# Patient Record
Sex: Female | Born: 1940
Health system: Southern US, Community
[De-identification: ages and names within clinical notes are randomized; demographics above are authoritative.]

## PROBLEM LIST (undated history)

## (undated) DIAGNOSIS — R079 Chest pain, unspecified: Secondary | ICD-10-CM

## (undated) DIAGNOSIS — E785 Hyperlipidemia, unspecified: Secondary | ICD-10-CM

## (undated) DIAGNOSIS — N390 Urinary tract infection, site not specified: Secondary | ICD-10-CM

## (undated) DIAGNOSIS — D649 Anemia, unspecified: Secondary | ICD-10-CM

## (undated) DIAGNOSIS — Z87442 Personal history of urinary calculi: Secondary | ICD-10-CM

## (undated) DIAGNOSIS — F329 Major depressive disorder, single episode, unspecified: Secondary | ICD-10-CM

## (undated) DIAGNOSIS — K635 Polyp of colon: Secondary | ICD-10-CM

## (undated) DIAGNOSIS — G629 Polyneuropathy, unspecified: Secondary | ICD-10-CM

## (undated) DIAGNOSIS — K5792 Diverticulitis of intestine, part unspecified, without perforation or abscess without bleeding: Secondary | ICD-10-CM

## (undated) DIAGNOSIS — I509 Heart failure, unspecified: Secondary | ICD-10-CM

## (undated) DIAGNOSIS — F419 Anxiety disorder, unspecified: Secondary | ICD-10-CM

## (undated) DIAGNOSIS — R05 Cough: Secondary | ICD-10-CM

## (undated) DIAGNOSIS — K631 Perforation of intestine (nontraumatic): Secondary | ICD-10-CM

## (undated) DIAGNOSIS — M069 Rheumatoid arthritis, unspecified: Secondary | ICD-10-CM

## (undated) DIAGNOSIS — G5600 Carpal tunnel syndrome, unspecified upper limb: Secondary | ICD-10-CM

## (undated) DIAGNOSIS — N189 Chronic kidney disease, unspecified: Secondary | ICD-10-CM

## (undated) DIAGNOSIS — E079 Disorder of thyroid, unspecified: Secondary | ICD-10-CM

## (undated) DIAGNOSIS — M1990 Unspecified osteoarthritis, unspecified site: Secondary | ICD-10-CM

## (undated) DIAGNOSIS — F32A Depression, unspecified: Secondary | ICD-10-CM

## (undated) DIAGNOSIS — E039 Hypothyroidism, unspecified: Secondary | ICD-10-CM

## (undated) DIAGNOSIS — R059 Cough, unspecified: Secondary | ICD-10-CM

## (undated) HISTORY — DX: Hyperlipidemia, unspecified: E78.5

## (undated) HISTORY — DX: Polyneuropathy, unspecified: G62.9

## (undated) HISTORY — PX: COLOSTOMY REVERSAL: SHX5782

## (undated) HISTORY — DX: Diverticulitis of intestine, part unspecified, without perforation or abscess without bleeding: K57.92

## (undated) HISTORY — PX: MENISCUS REPAIR: SHX5179

## (undated) HISTORY — DX: Polyp of colon: K63.5

## (undated) HISTORY — DX: Urinary tract infection, site not specified: N39.0

## (undated) HISTORY — DX: Depression, unspecified: F32.A

## (undated) HISTORY — DX: Heart failure, unspecified: I50.9

## (undated) HISTORY — DX: Anxiety disorder, unspecified: F41.9

## (undated) HISTORY — DX: Carpal tunnel syndrome, unspecified upper limb: G56.00

## (undated) HISTORY — DX: Chest pain, unspecified: R07.9

## (undated) HISTORY — PX: TONSILLECTOMY: SUR1361

## (undated) HISTORY — DX: Cough, unspecified: R05.9

## (undated) HISTORY — DX: Anemia, unspecified: D64.9

## (undated) HISTORY — PX: BREAST CYST EXCISION: SHX579

## (undated) HISTORY — PX: OTHER SURGICAL HISTORY: SHX169

## (undated) HISTORY — PX: PARATHYROIDECTOMY: SHX19

## (undated) HISTORY — DX: Major depressive disorder, single episode, unspecified: F32.9

## (undated) HISTORY — DX: Disorder of thyroid, unspecified: E07.9

## (undated) HISTORY — DX: Rheumatoid arthritis, unspecified: M06.9

## (undated) HISTORY — DX: Cough: R05

## (undated) HISTORY — PX: COLOSTOMY: SHX63

---

## 1982-11-07 HISTORY — PX: BACK SURGERY: SHX140

## 2000-11-29 ENCOUNTER — Ambulatory Visit (HOSPITAL_COMMUNITY): Admission: RE | Admit: 2000-11-29 | Discharge: 2000-11-29 | Payer: Self-pay | Admitting: Family Medicine

## 2000-11-29 ENCOUNTER — Encounter: Payer: Self-pay | Admitting: Family Medicine

## 2002-12-11 ENCOUNTER — Ambulatory Visit (HOSPITAL_COMMUNITY): Admission: RE | Admit: 2002-12-11 | Discharge: 2002-12-11 | Payer: Self-pay | Admitting: Family Medicine

## 2005-12-14 ENCOUNTER — Other Ambulatory Visit: Admission: RE | Admit: 2005-12-14 | Discharge: 2005-12-14 | Payer: Self-pay | Admitting: Family Medicine

## 2008-01-24 ENCOUNTER — Encounter: Admission: RE | Admit: 2008-01-24 | Discharge: 2008-01-24 | Payer: Self-pay | Admitting: Orthopedic Surgery

## 2008-02-01 ENCOUNTER — Encounter: Admission: RE | Admit: 2008-02-01 | Discharge: 2008-02-01 | Payer: Self-pay | Admitting: Orthopedic Surgery

## 2008-03-04 ENCOUNTER — Ambulatory Visit: Payer: Self-pay | Admitting: Internal Medicine

## 2008-03-04 DIAGNOSIS — R0602 Shortness of breath: Secondary | ICD-10-CM

## 2008-03-04 DIAGNOSIS — J984 Other disorders of lung: Secondary | ICD-10-CM | POA: Insufficient documentation

## 2008-03-04 DIAGNOSIS — R05 Cough: Secondary | ICD-10-CM

## 2008-03-04 DIAGNOSIS — I712 Thoracic aortic aneurysm, without rupture: Secondary | ICD-10-CM

## 2008-03-04 DIAGNOSIS — J309 Allergic rhinitis, unspecified: Secondary | ICD-10-CM | POA: Insufficient documentation

## 2008-03-13 ENCOUNTER — Encounter: Payer: Self-pay | Admitting: Internal Medicine

## 2008-03-16 ENCOUNTER — Inpatient Hospital Stay (HOSPITAL_COMMUNITY): Admission: EM | Admit: 2008-03-16 | Discharge: 2008-03-23 | Payer: Self-pay | Admitting: Emergency Medicine

## 2008-03-16 ENCOUNTER — Encounter (INDEPENDENT_AMBULATORY_CARE_PROVIDER_SITE_OTHER): Payer: Self-pay | Admitting: *Deleted

## 2008-05-01 ENCOUNTER — Telehealth: Payer: Self-pay | Admitting: Internal Medicine

## 2008-06-16 ENCOUNTER — Encounter: Admission: RE | Admit: 2008-06-16 | Discharge: 2008-07-07 | Payer: Self-pay | Admitting: Surgery

## 2008-07-09 ENCOUNTER — Inpatient Hospital Stay (HOSPITAL_COMMUNITY): Admission: RE | Admit: 2008-07-09 | Discharge: 2008-07-14 | Payer: Self-pay | Admitting: *Deleted

## 2008-07-09 ENCOUNTER — Encounter (INDEPENDENT_AMBULATORY_CARE_PROVIDER_SITE_OTHER): Payer: Self-pay | Admitting: *Deleted

## 2008-08-04 ENCOUNTER — Ambulatory Visit: Payer: Self-pay | Admitting: Internal Medicine

## 2008-08-05 ENCOUNTER — Telehealth: Payer: Self-pay | Admitting: Internal Medicine

## 2008-08-07 ENCOUNTER — Ambulatory Visit: Payer: Self-pay | Admitting: Surgery

## 2008-08-15 ENCOUNTER — Telehealth: Payer: Self-pay | Admitting: Internal Medicine

## 2008-10-17 ENCOUNTER — Ambulatory Visit (HOSPITAL_COMMUNITY): Admission: RE | Admit: 2008-10-17 | Discharge: 2008-10-17 | Payer: Self-pay | Admitting: Orthopaedic Surgery

## 2008-10-17 ENCOUNTER — Ambulatory Visit: Payer: Self-pay | Admitting: Vascular Surgery

## 2008-10-17 ENCOUNTER — Encounter (INDEPENDENT_AMBULATORY_CARE_PROVIDER_SITE_OTHER): Payer: Self-pay | Admitting: Orthopaedic Surgery

## 2009-06-15 ENCOUNTER — Encounter: Payer: Self-pay | Admitting: Gastroenterology

## 2009-06-26 ENCOUNTER — Encounter: Payer: Self-pay | Admitting: Gastroenterology

## 2009-07-08 ENCOUNTER — Encounter (INDEPENDENT_AMBULATORY_CARE_PROVIDER_SITE_OTHER): Payer: Self-pay | Admitting: *Deleted

## 2009-07-08 ENCOUNTER — Ambulatory Visit (HOSPITAL_COMMUNITY): Admission: RE | Admit: 2009-07-08 | Discharge: 2009-07-08 | Payer: Self-pay | Admitting: Internal Medicine

## 2009-07-30 ENCOUNTER — Ambulatory Visit: Payer: Self-pay | Admitting: Gastroenterology

## 2009-07-30 DIAGNOSIS — D509 Iron deficiency anemia, unspecified: Secondary | ICD-10-CM | POA: Insufficient documentation

## 2009-07-30 DIAGNOSIS — Z860100 Personal history of colon polyps, unspecified: Secondary | ICD-10-CM | POA: Insufficient documentation

## 2009-07-31 ENCOUNTER — Encounter: Payer: Self-pay | Admitting: Gastroenterology

## 2009-07-31 ENCOUNTER — Ambulatory Visit: Payer: Self-pay | Admitting: Gastroenterology

## 2009-07-31 ENCOUNTER — Telehealth: Payer: Self-pay | Admitting: Gastroenterology

## 2009-07-31 LAB — CONVERTED CEMR LAB
Basophils Absolute: 0 10*3/uL (ref 0.0–0.1)
Eosinophils Absolute: 0.1 10*3/uL (ref 0.0–0.7)
Folate: >20 ng/mL
Lymphocytes Relative: 19 % (ref 12.0–46.0)
MCHC: 32.1 g/dL (ref 30.0–36.0)
Monocytes Absolute: 0.4 10*3/uL (ref 0.1–1.0)
Neutrophils Relative %: 72.7 % (ref 43.0–77.0)
RDW: 18.7 % — ABNORMAL HIGH (ref 11.5–14.6)

## 2009-08-03 ENCOUNTER — Encounter: Payer: Self-pay | Admitting: Gastroenterology

## 2009-08-04 ENCOUNTER — Encounter: Payer: Self-pay | Admitting: Gastroenterology

## 2009-08-04 LAB — CONVERTED CEMR LAB
Beta Globulin: 5.2 % (ref 4.7–7.2)
IgG (Immunoglobin G), Serum: 1920 mg/dL — ABNORMAL HIGH (ref 694–1618)
IgM, Serum: 78 mg/dL (ref 60–263)
Total Protein, Serum Electrophoresis: 7.2 g/dL (ref 6.0–8.3)
Total Protein, Serum Electrophoresis: 7.1 g/dL (ref 6.0–8.3)

## 2009-08-10 ENCOUNTER — Ambulatory Visit: Payer: Self-pay | Admitting: Gastroenterology

## 2009-08-10 LAB — CONVERTED CEMR LAB
OCCULT 1: NEGATIVE
OCCULT 5: NEGATIVE

## 2009-08-12 ENCOUNTER — Encounter: Payer: Self-pay | Admitting: Gastroenterology

## 2009-08-26 ENCOUNTER — Ambulatory Visit: Payer: Self-pay | Admitting: Gastroenterology

## 2009-08-26 DIAGNOSIS — M2550 Pain in unspecified joint: Secondary | ICD-10-CM

## 2009-08-27 ENCOUNTER — Telehealth: Payer: Self-pay | Admitting: Gastroenterology

## 2009-09-03 ENCOUNTER — Encounter: Payer: Self-pay | Admitting: Gastroenterology

## 2009-09-09 ENCOUNTER — Encounter: Payer: Self-pay | Admitting: Gastroenterology

## 2009-09-21 ENCOUNTER — Encounter: Payer: Self-pay | Admitting: Gastroenterology

## 2009-10-07 ENCOUNTER — Encounter: Payer: Self-pay | Admitting: Gastroenterology

## 2009-10-12 ENCOUNTER — Encounter: Payer: Self-pay | Admitting: Gastroenterology

## 2009-11-26 ENCOUNTER — Encounter: Payer: Self-pay | Admitting: Gastroenterology

## 2009-12-28 ENCOUNTER — Encounter: Payer: Self-pay | Admitting: Gastroenterology

## 2010-01-13 ENCOUNTER — Encounter: Admission: RE | Admit: 2010-01-13 | Discharge: 2010-03-15 | Payer: Self-pay | Admitting: Internal Medicine

## 2010-01-26 ENCOUNTER — Encounter: Payer: Self-pay | Admitting: Gastroenterology

## 2010-01-27 ENCOUNTER — Encounter: Admission: RE | Admit: 2010-01-27 | Discharge: 2010-01-27 | Payer: Self-pay

## 2010-02-09 ENCOUNTER — Encounter: Payer: Self-pay | Admitting: Gastroenterology

## 2010-03-08 ENCOUNTER — Encounter: Payer: Self-pay | Admitting: Gastroenterology

## 2010-04-01 ENCOUNTER — Encounter: Payer: Self-pay | Admitting: Gastroenterology

## 2010-04-15 ENCOUNTER — Ambulatory Visit: Payer: Self-pay | Admitting: Gastroenterology

## 2010-06-15 ENCOUNTER — Encounter: Payer: Self-pay | Admitting: Gastroenterology

## 2010-08-30 ENCOUNTER — Ambulatory Visit: Payer: Self-pay | Admitting: Vascular Surgery

## 2010-09-02 ENCOUNTER — Encounter: Payer: Self-pay | Admitting: Gastroenterology

## 2010-09-07 ENCOUNTER — Ambulatory Visit: Payer: Self-pay | Admitting: Vascular Surgery

## 2010-12-02 ENCOUNTER — Encounter: Admission: RE | Admit: 2010-12-02 | Discharge: 2010-12-02 | Payer: Self-pay | Source: Home / Self Care

## 2010-12-07 NOTE — Letter (Signed)
Summary: Rheumatology/[facility]   Imported By: Lester Marcellus 03/29/2010 07:49:23  _____________________________________________________________________  External Attachment:    Type:   Image     Comment:   External Document

## 2010-12-07 NOTE — Consult Note (Signed)
Summary[facility]   Imported By: Lester Brundidge 01/08/2010 08:28:05  _____________________________________________________________________  External Attachment:    Type:   Image     Comment:   External Document

## 2010-12-07 NOTE — Consult Note (Signed)
Summary: [facility]  [facility]   Imported By: Sherian Rein 02/23/2010 10:59:30  _____________________________________________________________________  External Attachment:    Type:   Image     Comment:   External Document

## 2010-12-07 NOTE — Consult Note (Signed)
Summary: [facility]  [facility]   Imported By: Sherian Rein 09/13/2010 14:46:32  _____________________________________________________________________  External Attachment:    Type:   Image     Comment:   External Document

## 2010-12-07 NOTE — Assessment & Plan Note (Signed)
Summary: f/u constipation--ch.   History of Present Illness Visit Type: Follow-up Visit Primary GI MD: Melvia Heaps MD [facility] Primary Provider: Thayer Headings, MD  Requesting Provider: n/a Chief Complaint: chronic constipation  History of Present Illness:   Stephanie Ray has returned for follow-up of her anemia.  She has been followed at [facility] for her  rheumatologic disease causing severe joint pains.  Her main GI complaint is constipation for which she takes senna.  She goes several days without a bowel movement.   With constipation she  has mild abdominal discomfort and  worsening joint pains.  She takes ibuprofen intermittently.   GI Review of Systems    Reports abdominal pain and  bloating.     Location of  Abdominal pain: lower abdomen.    Denies acid reflux, belching, chest pain, dysphagia with liquids, dysphagia with solids, heartburn, loss of appetite, nausea, vomiting, vomiting blood, weight loss, and  weight gain.      Reports black tarry stools, constipation, hemorrhoids, and  rectal bleeding.     Denies anal fissure, change in bowel habit, diarrhea, diverticulosis, fecal incontinence, heme positive stool, irritable bowel syndrome, jaundice, light color stool, liver problems, and  rectal pain.    Current Medications (verified): 1)  Cyanocobalamin 1000 Mcg/ml Soln (Cyanocobalamin) .... Two Injections Weekly 2)  Stool Softener 100 Mg Caps (Docusate Sodium) .Marland Kitchen.. 1-2 Times Weekly 3)  Flora-Q  Caps (Probiotic Product) .... Three Tablets By Mouth Daily 4)  Vitamin D Synergy(Dosage Unknown) .... One Tablet By Mouth Two Times A Day 5)  Omega Pure 820 .... One Tablet By Mouth Two Times A Day 6)  Ibuprofen 200 Mg Tabs (Ibuprofen) .... One Tablet By [facility]  Eight Times Daily 7)  Calcium Carbonate 600 Mg Tabs (Calcium Carbonate) .... Once Daily 8)  Milk Thistle 175 Mg Caps (Milk Thistle) .... Take 1 Tablet By Mouth Once Daily 9)  Prednisone 10 Mg Tabs (Prednisone) .... Take 1 Tablet By  Mouth Once Daily 10)  Plaquenil 200 Mg Tabs (Hydroxychloroquine Sulfate) .... Take 1 Tablet Two Times A Day  Allergies (verified): 1)  ! Penicillin 2)  ! Erythromycin 3)  ! Sulfa 4)  ! Codeine 5)  ! Ketoralac 6)  ! * Aleve 7)  ! Morphine  Past History:  Past Medical History: Reviewed history from 07/30/2009 and no changes required. Allergic Rhinitis Chronic Cough since childhood Carpal Tunnel Right Wrist Anemia Anxiety Disorder Adenomatous Colon Polyps Depression Diverticulitis Hyperlipidemia ? Urinary Tract Infection  Past Surgical History: Reviewed history from 07/30/2009 and no changes required. back surgery--1984 cyst removed from breast left knee torn meniscus herniated disk colostomy-section  Family History: Reviewed history from 07/30/2009 and no changes required. cancer-mother breast hay fever - dad Family History of Colon Polyps:Brothers x 3  Family History of Heart Disease: Father  No FH of Colon Cancer:  Social History: Reviewed history from 07/30/2009 and no changes required. 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  Alcohol Use - no Illicit Drug Use - no Patient does not get regular exercise.   Vital Signs:  Patient profile:   70 year old female Height:      67 inches Weight:      152.38 pounds BMI:     23.95 Pulse rate:   76 / minute Pulse rhythm:   regular BP sitting:   106 / 68  (left arm) Cuff size:   regular  Vitals Entered By: June  McMurray CMA Duncan Dull) (April 15, 2010 8:40 AM)   Impression & Recommendations:  Problem # 1:  ANEMIA, IRON DEFICIENCY (ICD-280.9)  Her iron deficiency anemia may be related to her chronic NSAID use though stools were Hemoccult negative.  There is no evidence for malabsorption.  At this point there is nothing to suggest underlying small bowel disease associated with joint disease.  Colonoscopy in 2009 demonstrated a polyp which was  removed.  Procedure was complicated by perforation.  Additional workup including capsule endoscopy and upper endoscopy were negative for chest bleeding source.  Recommendations #1 iron supplementation MiraLax p.r.n. for constipation in lieu of senna  Problem # 2:  PAIN IN JOINT, MULTIPLE SITES (ICD-719.49) Therapy per hematology  Patient Instructions: 1)  Copy sent to : Thayer Headings, MD  2)                         Sigurd Sos, MD  [facility] 3)                         Orson Gear, MD  [facility] 4)  The medication list was reviewed and reconciled.  All changed / newly prescribed medications were explained.  A complete medication list was provided to the patient / caregiver.

## 2010-12-07 NOTE — Consult Note (Signed)
Summary: [facility]  [facility]   Imported By: Lester Leslie 11/11/2009 10:02:35  _____________________________________________________________________  External Attachment:    Type:   Image     Comment:   External Document

## 2010-12-07 NOTE — Letter (Signed)
Summary: Neurology Interval Note/[facility]  Ne[facility]rology Interval Note/[facility]   Imported By: Sherian Rein 04/21/2010 14:22:11  _____________________________________________________________________  External Attachment:    Type:   Image     Comment:   External Document

## 2010-12-07 NOTE — Letter (Signed)
Summary: [facility]umat[facility]   Imported By: Lester [place] 04/21/2010 09:01:05  _____________________________________________________________________  External Attachment:    Type:   Image     Comment:   External Document

## 2010-12-07 NOTE — Procedures (Signed)
Summary: [facility]  [facility]   Imported By: Lester [place] 07/13/2010 10:38:42  _____________________________________________________________________  External Attachment:    Type:   Image     Comment:   External Document

## 2010-12-07 NOTE — Consult Note (Signed)
Summary: [facility]  [facility]umatology Clinic Note/[facility]   Imported By: Sherian Rein 11/13/2009 13:32:36  _____________________________________________________________________  External Attachment:    Type:   Image     Comment:   External Document

## 2011-01-30 IMAGING — CT CT ABDOMEN W/ CM
4 of 5 series · 14 of 46 positions shown, 19 images · IV contrast (agent unspecified)
Comparison: 08/04/2008

 CT CHEST

07/09/2009 - DUPLICATE COPY for exam association in [facility] – No change from original report.
CLINICAL DATA: Weight loss

 CT CHEST, ABDOMEN AND PELVIS WITH CONTRAST
TECHNIQUE: Multidetector CT imaging of the chest, abdomen and
 pelvis was performed following the standard protocol during bolus
 administration of intravenous contrast.
 Contrast: 80 ml Emnipaque-6LL

[Series 2: chest/abd/pelvis · axial · 0.78mm/px · z∈[-563,-63]mm · 8 of 128 slices shown]
[im 14/128  soft-tissue]
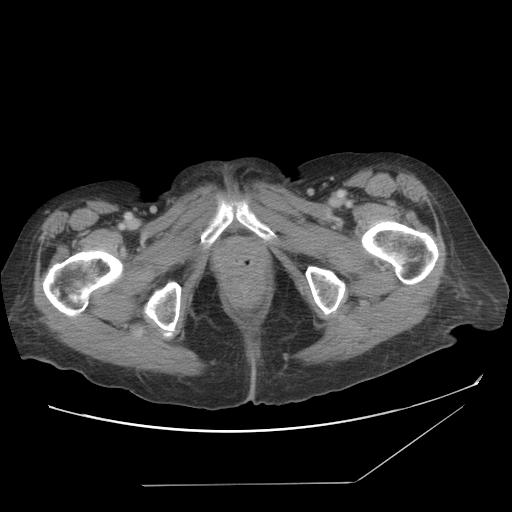
[im 27/128  soft-tissue]
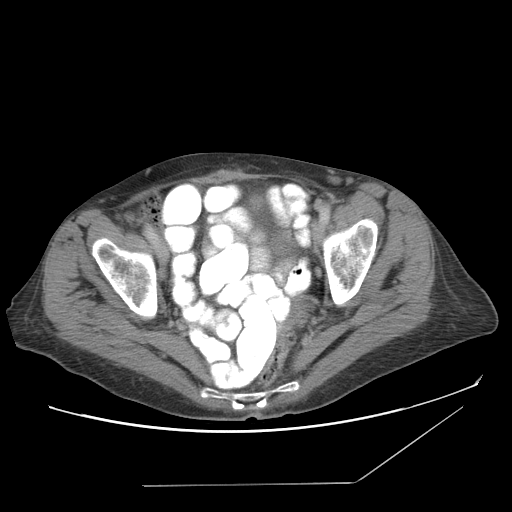
[im 41/128  soft-tissue]
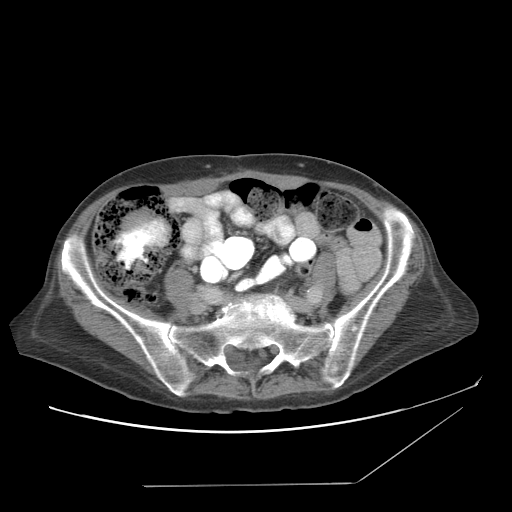
[im 54/128  soft-tissue]
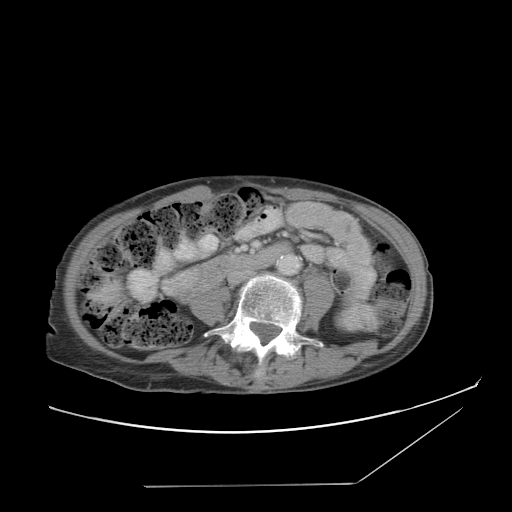
[im 74/128  soft-tissue]
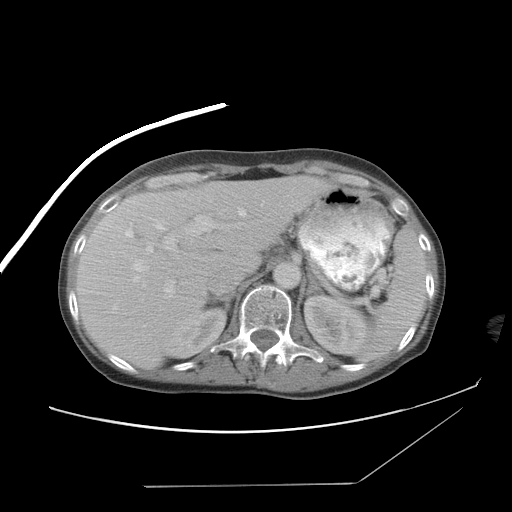
[im 87/128  soft-tissue]
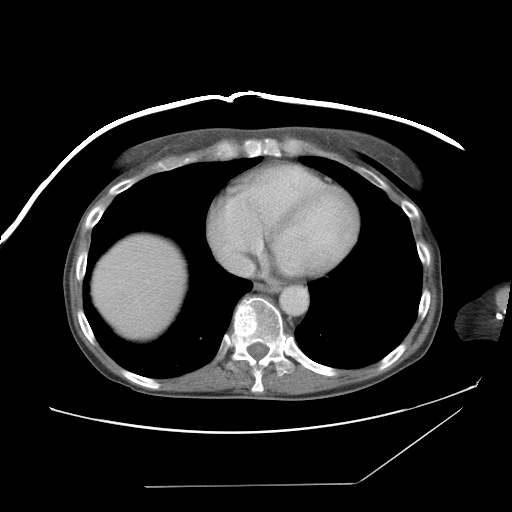
[im 101/128  soft-tissue]
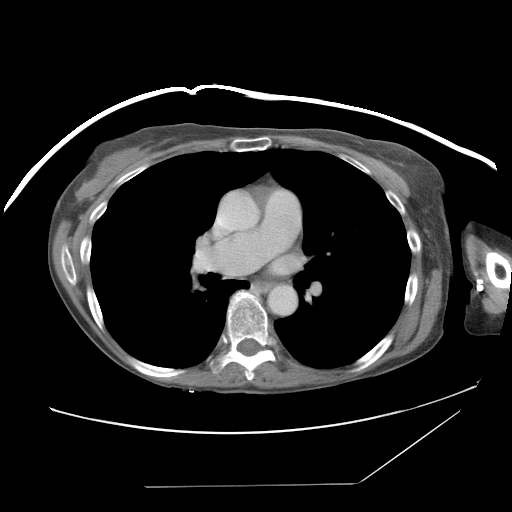
[im 114/128  soft-tissue]
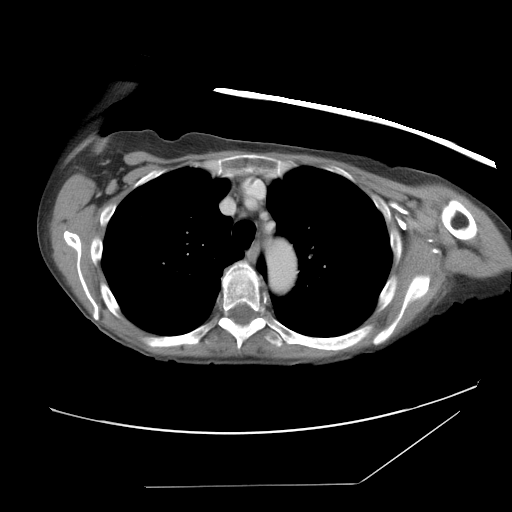

[Series 5: renal delays · axial · 0.77mm/px · z∈[-333,-283]mm · 2 of 30 slices shown, 5 images]
[im 10/30  soft-tissue]
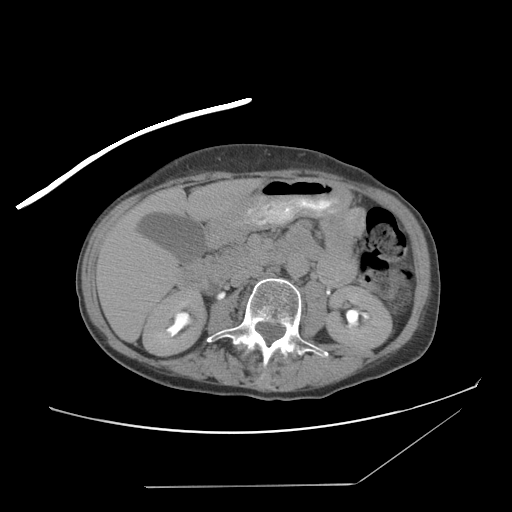
[im 10/30  lung]
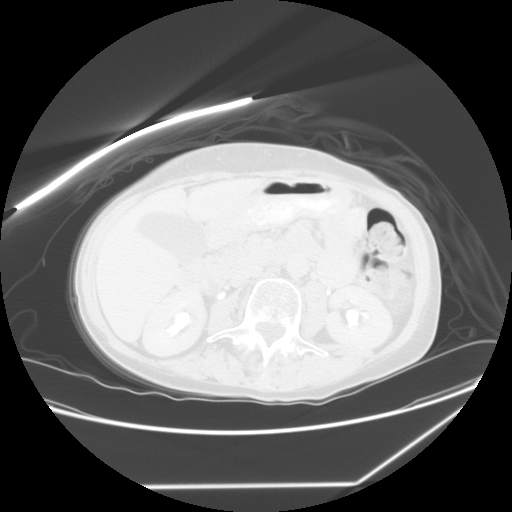
[im 10/30  bone]
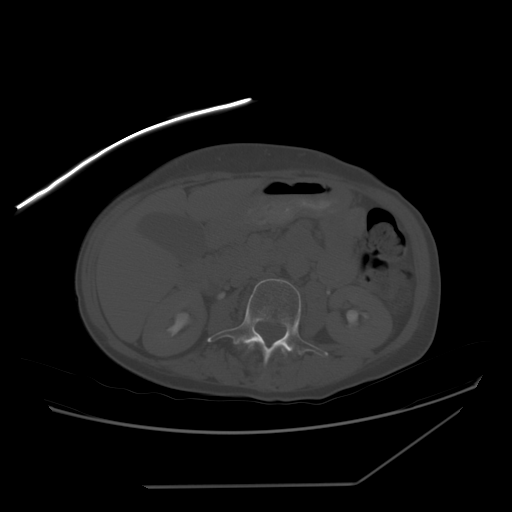
[im 20/30  soft-tissue]
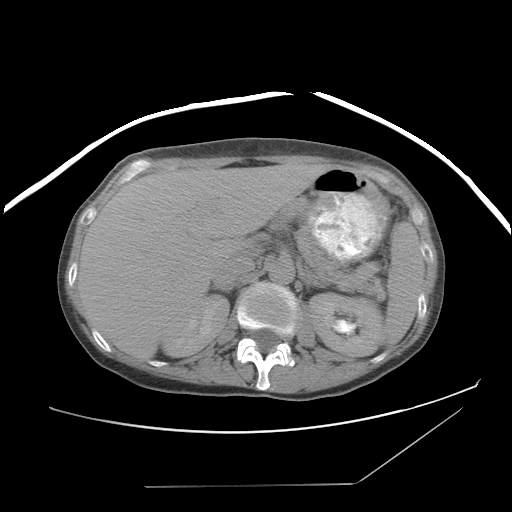
[im 20/30  lung]
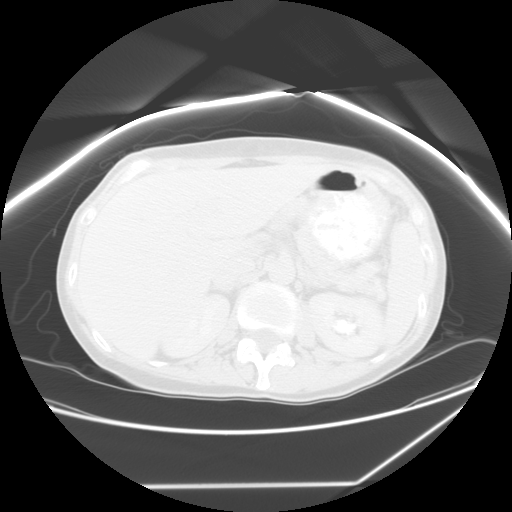

[Series 400: sag · sagittal · 0.90mm/px · 1 of 125 slices shown, 2 images]
[im 42/125  soft-tissue]
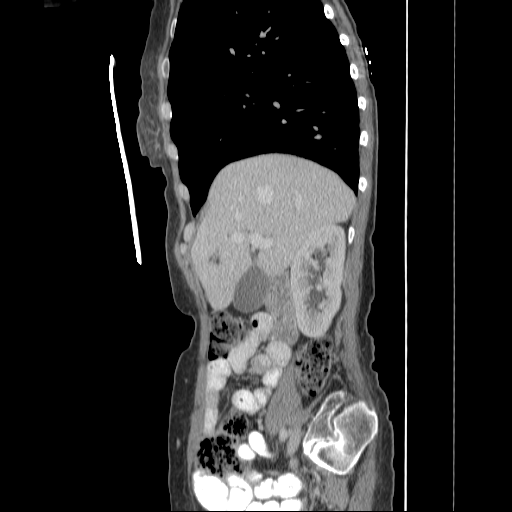
[im 42/125  bone]
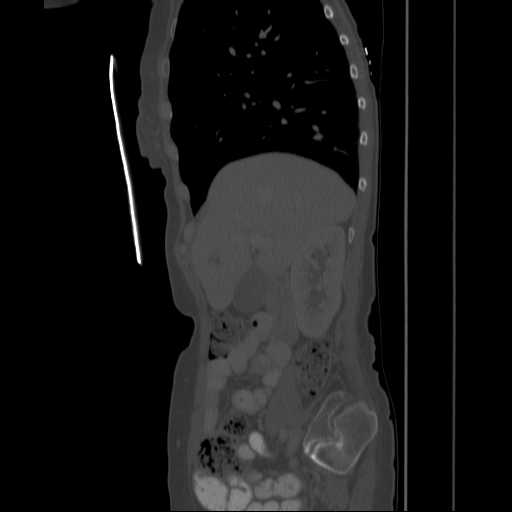

[Series 401: cor · coronal · 0.90mm/px · 3 of 89 slices shown, 4 images]
[im 30/89  soft-tissue]
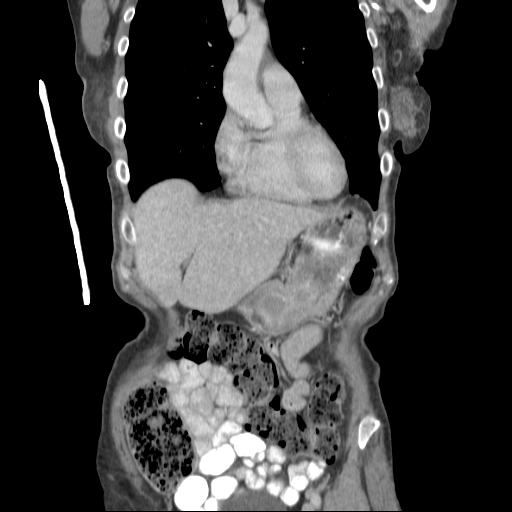
[im 40/89  soft-tissue]
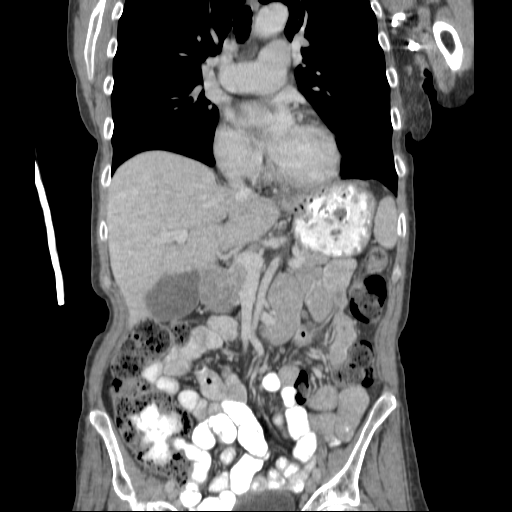
[im 40/89  bone]
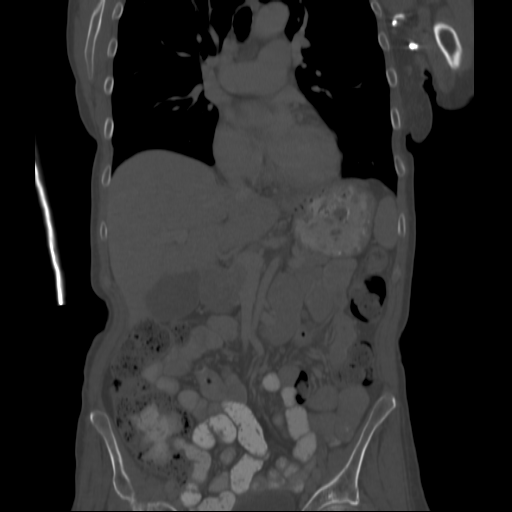
[im 49/89  soft-tissue]
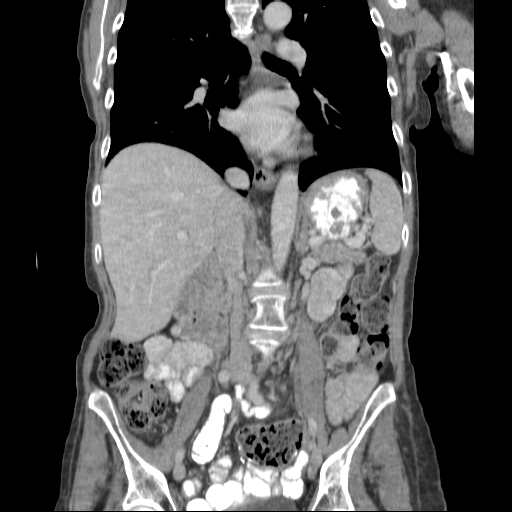

[14 of 46 positions shown; findings below may reference images not displayed]

FINDINGS: Negative abnormal mediastinal adenopathy. No
 pneumothorax or pleural effusion. Stable right lower lobe 6 mm
 nodule on image 32. Stable 4 mm nodule in the left lower lobe on
 image 31.
IMPRESSION: Stable bibasilar nodules. Stability over 2 years supports benign
 etiology.

 CT ABDOMEN
FINDINGS: Liver hypodensities at the dome are stable. Peri
 hepatic fluid has resolved. The gallbladder, spleen, adrenal
 glands, pancreas are stable. Left nephrolithiasis is suspected.
 Kidneys otherwise stable. No free fluid. Extensive stool
 throughout the colon. Small hiatal hernia is redemonstrated.
IMPRESSION: Hiatal hernia.

 Left nephrolithiasis is suspected.

 Peri hepatic fluid has resolved.

 Extensive stool.

 CT PELVIS
FINDINGS: Bladder is within normal limits. No free fluid. No
 abnormal adenopathy. Sigmoid resection has been performed with
 reanastomosis. Prominent stool throughout the rectosigmoid.
IMPRESSION: Postoperative change. No acute intrapelvic pathology.

## 2011-02-10 ENCOUNTER — Telehealth: Payer: Self-pay | Admitting: *Deleted

## 2011-02-10 NOTE — Telephone Encounter (Signed)
Summary of Call: last ct chestchest sept 2009. SHowed some nodules. She needed ct chest 18-24 months. Please call her and find out what is goiung on and pls set up CT chest Initial call taken by: Kalman Shan MD,  January 25, 2011 12:03 AM  I spoke to patient and she states she has been seeing MD at [facility] ans she thinks she had a ct chest there recently. She request to call them and find out if she did. I advised if she did have a ct chest then to have them send [place] the scan, and if not she needs a f/u here with ct chest first. Pt states she will call and let me know.

## 2011-03-09 ENCOUNTER — Telehealth: Payer: Self-pay | Admitting: Internal Medicine

## 2011-03-09 DIAGNOSIS — J984 Other disorders of lung: Secondary | ICD-10-CM

## 2011-03-09 NOTE — Telephone Encounter (Signed)
Spoke w/ pt and she wants to know if she is due for another ct scan. Pt states she had one at [facility] November 2010 and they was to fax over the report to Dr. Marchelle Gearing. Pt states she spoke w/ Victorino Dike last month and was getting this report faxed over for MR to review. Please advise Dr. Marchelle Gearing if you ever received this report and if pt needs f/u CT. Thanks  Carver Fila, CMA    *Pt aware MR is currently out of the office

## 2011-03-11 NOTE — Telephone Encounter (Signed)
Spoke w/ pt and she states she has not had a ct since 2010. Pt is aware order was sent to have ct set up and then she will see MR afterwards. Will forward to [facility] so they are aware. Please advise Thanks  Carver Fila, CMA

## 2011-03-11 NOTE — Telephone Encounter (Signed)
Scheduled ct chest at [facility] 03/15/11@2 :00pm pt transferred to schedulers to set up appt with MR

## 2011-03-11 NOTE — Telephone Encounter (Signed)
Reviewed rcords in centricity. There is an office note at [facility] rheumatologist stating that in sept 2010 she had 6mm RLL nodule. When compared to our report from Sept 2009 this si stable over 1 year. WE need to establish 2 year stability - so if she never had a scan after august 2011 she needs one (without contrast) and then see me

## 2011-03-15 ENCOUNTER — Ambulatory Visit (INDEPENDENT_AMBULATORY_CARE_PROVIDER_SITE_OTHER)
Admission: RE | Admit: 2011-03-15 | Discharge: 2011-03-15 | Disposition: A | Discharge status: Home / Self Care | Payer: Medicare Other | Source: Ambulatory Visit | Attending: Internal Medicine | Admitting: Internal Medicine

## 2011-03-15 DIAGNOSIS — J984 Other disorders of lung: Secondary | ICD-10-CM

## 2011-03-17 ENCOUNTER — Encounter: Payer: Self-pay | Admitting: Emergency Medicine

## 2011-03-18 ENCOUNTER — Encounter: Payer: Self-pay | Admitting: Internal Medicine

## 2011-03-18 ENCOUNTER — Ambulatory Visit (INDEPENDENT_AMBULATORY_CARE_PROVIDER_SITE_OTHER): Payer: Medicare Other | Admitting: Internal Medicine

## 2011-03-18 DIAGNOSIS — R059 Cough, unspecified: Secondary | ICD-10-CM

## 2011-03-18 DIAGNOSIS — R05 Cough: Secondary | ICD-10-CM

## 2011-03-18 DIAGNOSIS — J984 Other disorders of lung: Secondary | ICD-10-CM

## 2011-03-18 NOTE — Patient Instructions (Signed)
Your nodule has been stable for over 2 year No further followup needed Please return to office if you feel any respiratory difficulties Good luck

## 2011-03-18 NOTE — Assessment & Plan Note (Signed)
Stable march 2009 -> may 2012, 6mm RLL nodule No further followup

## 2011-03-18 NOTE — Progress Notes (Signed)
Subjective:    Patient ID: Stephanie Ray, female    DOB: 08-20-1941, 70 y.o.   MRN: [id number]  HPI 69 year old non-smoker with allergic cough since childhood. Originally seen in 03/04/2008 for CT scan March 2009 that showed RLL 6mm non-calcified nodule. Lost to followup after that due to PMR v RA symptoms. Now following at [facility] rheum and Dr. Becky Augusta. No longer seeing Dr. Corliss Skains. OVerall still with neuropathy, tingling, arthralgias that limit quality of life. Chronic allergic cough is very mild and she does not want any help. For nodule, she was lost to followup due to her health issues but did have a followup CT chest 07/08/2009 that showed continued stability of nodules. We called her and set up recent CT chest 5.8//2012 that showed continued stability without change (personally reviwed)   Review of Systems Constitutional:   No  weight loss, night sweats,  Fevers, chills, POSITIVE FOR FATIGUE e. HEENT:   No headaches,  Difficulty swallowing,  Tooth/dental problems,  Sore throat,                No sneezing, itching, ear ache, nasal congestion, post nasal drip,   CV:  No chest pain,  Orthopnea, PND, swelling in lower extremities, anasarca, dizziness, palpitations  GI  No heartburn, indigestion, abdominal pain, nausea, vomiting, diarrhea, change in bowel habits, loss of appetite  Resp: No shortness of breath with exertion or at rest.  No excess mucus,  No coughing up of blood.  No change in color of mucus.  No wheezing.  No chest wall deformity. MILD COUGH +  Skin: no rash or lesions.  GU: no dysuria, change in color of urine, no urgency or frequency.  No flank pain.  MS: Diffuse ARTHRALGIA +  No decreased range of motion.  No back pain.  Psych:  No change in mood or affect. No depression or anxiety.  No memory loss.     Objective:   Physical Exam  [vitalsreviewed. Constitutional: She is oriented to person, place, and time. She appears well-developed and well-nourished. No  distress.  HENT:  Head: Normocephalic and atraumatic.  Right Ear: External ear normal.  Left Ear: External ear normal.  Mouth/Throat: Oropharynx is clear and moist. No oropharyngeal exudate.  Eyes: Conjunctivae and EOM are normal. Pupils are equal, round, and reactive to light. Right eye exhibits no discharge. Left eye exhibits no discharge. No scleral icterus.  Neck: Normal range of motion. Neck supple. No JVD present. No tracheal deviation present. No thyromegaly present.  Cardiovascular: Normal rate, regular rhythm, normal heart sounds and intact distal pulses.  Exam reveals no gallop and no friction rub.   No murmur heard. Pulmonary/Chest: Effort normal and breath sounds normal. No respiratory distress. She has no wheezes. She has no rales. She exhibits no tenderness.  Abdominal: Soft. Bowel sounds are normal. She exhibits no distension and no mass. There is no tenderness. There is no rebound and no guarding.  Musculoskeletal: Normal range of motion. She exhibits no edema and no tenderness.  Lymphadenopathy:    She has no cervical adenopathy.  Neurological: She is alert and oriented to person, place, and time. She has normal reflexes. No cranial nerve deficit. She exhibits normal muscle tone. Coordination normal.  Skin: Skin is warm and dry. No rash noted. She is not diaphoretic. No erythema. No pallor.  Psychiatric: She has a normal mood and affect. Her behavior is normal. Judgment and thought content normal.  Assessment & Plan:

## 2011-03-18 NOTE — Assessment & Plan Note (Signed)
Has allergic history. Does not want active followup

## 2011-03-21 NOTE — Assessment & Plan Note (Signed)
This is very mild for her and she copes well with it. Had it all her life. She is not interested in pursing workup or mgmt for this.

## 2011-03-22 NOTE — H&P (Signed)
Stephanie Ray, Stephanie Ray               ACCOUNT NO.:  [id number]   MEDICAL RECORD NO.:  [id number]          PATIENT TYPE:  INP   LOCATION:  1225                         FACILITY:  [facility]   PHYSICIAN:  Alfonse Ras, MD   DATE OF BIRTH:  20-May-1941   DATE OF ADMISSION:  03/16/2008  DATE OF DISCHARGE:                              HISTORY & PHYSICAL   ADMISSION DIAGNOSIS:  Abdominal pain, diverticulitis.   HISTORY OF PRESENT ILLNESS:  The patient is a very pleasant 70 year old  white female who has been having some vague lower abdominal discomfort  with extension of pain to her hips and shoulders over the last 45  months.  She has been followed and treated by Dr. Corliss Skains who has  placed her on prednisone at a weaning dose but currently at 20 mg a day  and Plaquenil.  Because of her continued lower abdominal discomfort, she  was referred to Dr. Anselmo Rod.  According to the patient, she  underwent colonoscopy as an outpatient 6 days ago and had perhaps a  biopsy done, however, there is not one listed in the computer.  The  patient did fine after a colonoscopy but earlier, last evening began  having acute onset of abdominal discomfort and was brought to the  emergency room.  She underwent CAT scan which shows a small amount of  free fluid near the liver, one minimally thickened small bowel loop in  the right upper quadrant, a couple of tiny air bubbles extraluminal in  the upper abdomen, severe diverticulosis with some mild inflammation and  a small pocket of extraluminal air in the pelvis as well.  There is no  obvious abscess or minimal free fluid in the pelvis.   The patient states that when she was admitted her pain was of 10/10, but  currently is about 3/10 with a few doses of Dilaudid.  She did have  emesis x1 after CT scan and had an NG tube placed here in the ER.   REVIEW OF SYSTEMS:  Review of systems was negative for nausea or  vomiting prior to coming in, but more  significant for pain only.  Otherwise review of systems is as positive for proximal joint discomfort  including her shoulders and hips as well as some numbness in her right  hand.   PAST MEDICAL HISTORY:  None.   PAST SURGICAL HISTORY:  Significant only for colonoscopy as above.   MEDICATIONS:  1. Prednisone 20 mg once a day.  2. Plaquenil 200 mg once a day.  3. Multivitamins.   PHYSICAL EXAMINATION:  GENERAL:  On physical exam, she is an age-  appropriate white female and who is somewhat ill appearing.  VITAL SIGNS:  Her temperature is 98.9, heart rate 78, respiratory rate  is 20, and blood pressure is 138/86.  HEENT:  Exam is benign.  Normocephalic and atraumatic.  Pupils are  equal, round, reactive to light.  Oral mucosa is without lesions.  NECK:  Supple and soft without thyromegaly or cervical adenopathy.  LUNGS:  Clear to auscultation and percussion x2.  HEART:  Regular rate and rhythm without murmurs, rubs, or gallops.  ABDOMEN:  Soft, but moderately tender in the left lower quadrant.  There  is no evidence of rebound or diffuse peritonitis.  EXTREMITIES:  Show no clubbing, cyanosis, or edema.   Labs show a white blood cell count of 12.5 with 92% neutrophils.   IMPRESSION:  Probable diverticulitis with possible microperforation, but  because of the use of steroids over the last few months we will admit  her for IV antibiotics and close clinical follow-up.  Other  possibilities of course would include a primary small bowel perforation  of questionable etiology and questionable perforated ulcer, however with  the paucity of free air, I think that is unlikely.      Alfonse Ras, MD  Electronically Signed     KRE/MEDQ  D:  03/16/2008  T:  03/16/2008  Job:  [id number]   cc:   Anselmo Rod, M.D.  Fax: [phone]   Dr. Corliss Skains

## 2011-03-22 NOTE — H&P (Signed)
Stephanie Ray, Stephanie Ray               ACCOUNT NO.:  [id number]   MEDICAL RECORD NO.:  [id number]          PATIENT TYPE:  INP   LOCATION:  1522                         [facility]ACILITY[facility]   PHYSICIAN:  Alfonse Ras, MD   DATE OF BIRTH:  [birth year]   DATE OF ADMISSION:  07/09/2008  DATE OF DISCHARGE:                              HISTORY & PHYSICAL   ADMISSION DIAGNOSIS:  Status post Hartmann resection with end colostomy.   REASON FOR ADMISSION:  Takedown of colostomy.   HISTORY OF PRESENT ILLNESS:  The patient is a very pleasant 70 year old  white female who underwent a Hartmann procedure approximately 4 months  ago for perforation of sigmoid colon, status post colonoscopy.  The  patient has done well with her colostomy, has been weaned off of her  steroids, and comes in now after home bowel prep for takedown of her  colostomy.   PAST MEDICAL HISTORY:  Past medical history is significant only for  joint pain for which she is being treated by Dr. Corliss Skains.   PAST SURGICAL HISTORY:  Significant only as above.   MEDICATIONS:  Plaquenil 200 mg a day, ibuprofen p.r.n., and  multivitamins.   PHYSICAL EXAMINATION:  GENERAL:  On physical exam, she is an age-  appropriate white female in no distress.  VITAL SIGNS:  Her temperature is 98, heart rate is 76, and respiratory  rate is 20.  HEENT:  Benign.  Normocephalic and atraumatic.  Pupils are equal, round,  and reactive to light.  LUNGS:  Clear to auscultation and percussion x2.  HEART:  Regular rate and rhythm without murmurs, rubs, or gallops.  ABDOMEN: Soft, completely nontender with a nice-appearing stoma in the  left lower quadrant and a well-healed  vertical midline scar.  EXTREMITIES:  No clubbing, cyanosis, or edema.   IMPRESSION:  Status post Hartmann resection now for elective takedown of  her colostomy.  July 09, 2008      Alfonse Ras, MD  Electronically Signed     KRE/MEDQ  D:  07/09/2008  T:  07/09/2008   Job:  [id number]   cc:   Anselmo Rod, M.D.  Fax: [phone]   Dr. Corliss Skains

## 2011-03-22 NOTE — Consult Note (Signed)
NEW PATIENT CONSULTATION   Ray, Stephanie L  DOB:  10-25-1941                                       09/07/2010  CHART#:[id number]   Patient is 70 year old female referred by Dr. Rennis Chris for swelling in the  ankles.  This patient has had multiple problems over the last few years.  Has had increasing pain over both lower extremities with a diagnosis of  chronic neuropathy.  She has a rheumatologic disorder and has been  evaluated by Dr. Sigurd Sos at [facility] and is on chronic prednisone  therapy.  Over the last few months, she has noted painful swelling in  both lower extremities, particularly in the ankle and foot area but  somewhat in the calf.  She has no history of DVT, thrombophlebitis,  stasis ulcers, or varicose veins.  She has worn short-leg elastic  compression stockings at times with some benefit but is not actively  wearing them now.   CHRONIC MEDICAL PROBLEMS:  1. History of lumbar surgery.  2. History of parathyroid surgery.  3. Neuropathy.  4. Status post perforated colon with colostomy, which occurred during      a colonoscopy.  5. Negative for coronary artery disease, diabetes, hypertension, or      stroke.   SOCIAL HISTORY:  The patient is married, has 1 child.  Does not use  tobacco or alcohol.   FAMILY HISTORY:  Positive for coronary artery disease in her father.  Negative for diabetes and stroke.   REVIEW OF SYSTEMS:  Positive for lower extremity discomfort with walking  and at rest, arthritis, joint pain, muscle pain.  All other systems are  negative in review of systems.   PHYSICAL EXAMINATION:  Blood pressure is 111/78, heart rate 81,  respirations 26.  General:  She is a well-developed and well-nourished  female in no apparent distress, alert and oriented x3.  HEENT:  Normal  for age.  EOMs intact.  Lungs:  Clear to auscultation.  No rhonchi or  wheezing.  Cardiovascular:  Regular rhythm with no murmurs.  Carotid  pulses are 3+.  No  bruits audible.  Abdomen:  Soft, nontender with no  masses.  Musculoskeletal:  Free of major deformities.  Neurologic:  Normal except for decreased sensation in the feet.  Skin:  Free of  rashes.  Lower extremity exam reveals 3+ femoral and popliteal and 2+  dorsalis pedis pulses bilaterally.  Minimal edema is noted in the lower  extremities today.  She does have a few scattered spider veins in the  thigh and calf.  No varicosities are noted.  No hyperpigmentation or  ulcerations noted.   Venous duplex exam was performed in our vascular lab on 08/30/2010 which  revealed no evidence of DVT.  She does have some mild reflux in the  common femoral veins.   I do not see any severe pathology other than some mild reflux in her  common femoral veins.  The best treatment would consist of elevation of  her legs at night to be followed by elastic compression during the  daytime.  I have discussed this with her and have also stated that no  further workup is indicated for this problem at this time.  She will  return to see [place] on a p.r.n. basis.     Quita Skye Hart [place], M.D.  Electronically Signed  JDL/MEDQ  D:  09/07/2010  T:  09/08/2010  Job:  [id number]   cc:   Vania Rea. Supple, M.D.  Dr. Sigurd Sos, Rheumatology, [facility]

## 2011-03-22 NOTE — Consult Note (Signed)
NAMEGUDELIA, EUGENE               ACCOUNT NO.:  [id number]   MEDICAL RECORD NO.:  [id number]          PATIENT TYPE:  INP   LOCATION:  1225                         FACILITY:  [facility]   PHYSICIAN:  Jordan Hawks. Elnoria Howard, MD    DATE OF BIRTH:  [birth year]   DATE OF CONSULTATION:  01/15/2008  DATE OF DISCHARGE:                                 CONSULTATION   REASON FOR CONSULTATION:  Colonic perforation, abdominal pain.   HISTORY OF PRESENT ILLNESS:  This is a 70 year old female, with a past  medical history of arthritis, constipation, herniated disk, renal  stones, and UTIs, who is admitted to the hospital with complaints of  acute severe abdominal pain.  The patient underwent a recent colonoscopy  by Dr. Loreta Ave on Mar 10, 2008, for complaints of persistent bilateral lower  abdominal pain.  The patient underwent a colonoscopy for evaluation of  her bilateral abdominal pain. The procedure was performed, and she was  noted to have significant pan diverticulosis, and three polyps were  removed; two were removed with cold biopsy forceps, and a third was  removed with a hot snare.  The patient did well immediately after the  procedure.  However, on Mar 15, 2008, the patient complained of severe  abdominal pain that was acute in onset.  Because of the pain, she was  immediately directed to present to the emergency room for further  evaluation and treatment. Overnight a CT scan was performed, and she is  noted to have a small amount of free air in the sigmoid region which was  consistent with a perforation.  There was no clear evidence of  inflammation in regards to diverticulitis at that point. She was  subsequently admitted by Dr. Baruch Merl for further evaluation and  treatment, and a GI consult was requested to help with her management.   PAST MEDICAL AND SURGICAL HISTORY:  Is as stated above.   FAMILY HISTORY:  Noncontributory.   SOCIAL HISTORY:  The patient is married.  No alcohol, tobacco or  illicit  drug use.   HOME MEDICATIONS:  Prednisone, fish oil, calcium and vitamin D.   ALLERGIES:  KETOROLAC, PENICILLIN, SULFA, CODEINE, and ERYTHROMYCIN.   REVIEW OF SYSTEMS:  As stated above in History of Present Illness,  otherwise negative.   PHYSICAL EXAMINATION:  VITAL SIGNS:  Blood pressure is 110/71, heart  rate 98, respirations 16, temperature 98.4.  GENERAL:  The patient is in no acute distress, but she is uncomfortable.  HEENT:  Normocephalic, atraumatic.  Extraocular muscles intact.  NECK:  Supple.  No lymphadenopathy.  LUNGS:  Clear to auscultation bilaterally.  CARDIOVASCULAR:  Regular rate and rhythm.  ABDOMEN:  Flat, soft, tender in left lower quadrant and some tenderness  on the right side.  No rebound or rigidity.  Positive bowel sounds.  EXTREMITIES:  No clubbing, cyanosis or edema.   LABORATORY VALUES:  White blood cell count is 11.6, hemoglobin 11.6,  platelets at 228.  Sodium 141, potassium 4.0, chloride 106, CO2 27,  glucose 107, BUN 14, creatinine 0.9, total bilirubin 1.0, alkaline  phosphatase 63, AST  17, ALT 16.  Urinalysis is negative.   IMPRESSION:  1. Colonic perforation.  2. Sigmoid diverticulosis.   After evaluation of the patient, it appears that there is a perforation;  however, the exact nature of the perforation is uncertain at this time  which may be secondary to diverticulitis with resultant perforation or a  post polypectomy perforation. Upon review of Dr. Kenna Gilbert prior  colonoscopy report, there were no complications with removal of the  polyp with the hot snare.  The patient is currently on prednisone, and  this can result in a weakening of her colonic tissue after a hot snare.  I discussed the case with Dr. Colin Benton, and the plan is to take the patient  to the operating room in order to identify the cause of the perforation.   PLAN:  1. Await surgical evaluation.  2. Continue with supportive care.      Jordan Hawks Elnoria Howard, MD   Electronically Signed     PDH/MEDQ  D:  03/16/2008  T:  03/16/2008  Job:  [id number]   cc:   Pollyann Savoy, M.D.  Fax: [phone]   Deatra James, M.D.  Fax: [phone]   Alfonse Ras, MD  [address]  [place]  [place] [address]

## 2011-03-22 NOTE — Procedures (Signed)
DUPLEX DEEP VENOUS EXAM - LOWER EXTREMITY   INDICATION:  Edema/pain per standing order.   HISTORY:  Edema:  Complaint of left ankle swelling for 1 month.  Trauma/Surgery:  History of left Baker's cyst removal per the patient.  Pain:  Bilateral lower leg/deep pain when standing or with pressure.  PE:  No.  Previous DVT:  No.  Anticoagulants:  Other:   DUPLEX EXAM:                CFV   SFV   PopV  PTV    GSV                R  L  R  L  R  L  R   L  R  L  Thrombosis    o  o  o  o  o  o  o   o  o  o  Spontaneous   +  +  +  +  +  +  +   +  +  +  Phasic        +  +  +  +  +  +  +   +  +  +  Augmentation  +  +  +  +  +  +  +   +  +  +  Compressible  +  +  +  +  +  +  +   +  +  +  Competent     o  o  +  +  +  +  +   +  +  +   Legend:  + - yes  o - no  p - partial  D - decreased   IMPRESSION:  1. No evidence of deep vein thrombosis noted in the bilateral lower      extremities.  2. Clinically significant reflux noted focally in the bilateral common      femoral veins.  3. There is a cystic structure measuring 3.9 x 1.3 x 1.4 cm noted in      the left medial popliteal fossa which is consistent with a Baker's      cyst.      _____________________________  Quita Skye. Hart [place], M.D.   CH/MEDQ  D:  08/30/2010  T:  08/30/2010  Job:  [id number]

## 2011-03-22 NOTE — Procedures (Signed)
DUPLEX DEEP VENOUS EXAM - LOWER EXTREMITY   INDICATION:  Three days of left leg pain and edema.   HISTORY:  Edema:  Three days, left leg.  Trauma/Surgery:  Recent reverse colostomy.  Pain:  Left leg.  PE:  No.  Previous DVT:  No.  Anticoagulants:  No.  Other:   DUPLEX EXAM:                CFV   SFV   PopV  PTV    GSV                R  L  R  L  R  L  R   L  R  L  Thrombosis    o  o     o     o      o     o  Spontaneous   +  +     +     +      +     +  Phasic        +  +     +     +      +     +  Augmentation  +  +     +     +      +     +  Compressible  +  +     +     +      +     +  Competent     P  P     +     +      +     +   Legend:  + - yes  o - no  p - partial  D - decreased   IMPRESSION:  1. No evidence of left leg deep or superficial vein thrombus.  2. Mild reflux is seen in the common femoral veins bilaterally.  3. Large irregular hyperechoic structure seen at the left popliteal      fossa, consistent with ruptured baker's cyst.   _____________________________  V. Charlena Cross, MD   MC/MEDQ  D:  08/07/2008  T:  08/07/2008  Job:  [id number]

## 2011-03-22 NOTE — Op Note (Signed)
Stephanie Ray, Stephanie Ray               ACCOUNT NO.:  [id number]   MEDICAL RECORD NO.:  [id number]          PATIENT TYPE:  INP   LOCATION:  1225                         FACILITY:  [facility]   PHYSICIAN:  Alfonse Ras, MD   DATE OF BIRTH:  05-01-1941   DATE OF PROCEDURE:  03/16/2008  DATE OF DISCHARGE:                               OPERATIVE REPORT   PREOPERATIVE DIAGNOSIS:  Probable perforated viscus.   POSTOPERATIVE DIAGNOSIS:  Perforated sigmoid colon.   PROCEDURE:  Laparoscopy, exploratory laparotomy, and Hartmann resection  with end colostomy.   SURGEON:  Alfonse Ras, M.D.   ASSISTANT:  Clovis Pu. Cornett, M.D.   DESCRIPTION:  The patient was taken to the operating room, placed in  supine position.  After adequate general anesthesia was induced using  endotracheal tube the abdomen was prepped and draped in normal sterile  fashion using a transverse infraumbilical incision, I dissected down to  the fascia.  This was opened vertically.  Peritoneum was entered and 0  Vicryl pursestring suture was placed around the fascial defect.  Hassan  trocar was placed in the abdomen and pneumoperitoneum was obtained.  Laparoscopy revealed a fair amount of purulent material near the liver  in the left pericolic gutter and the right pericolic gutter.  At this  point knowing the patient's history of being on steroids and probable  some perforation status post colonoscopy, I opted to open the patient.   A vertical midline incision was made and fascia was opened and  peritoneum was again entered.  All purulent material was aspirated, on  mobilizing the sigmoid colon about a 2 cm antimesenteric perforation was  identified with fecal contamination surrounding it.  This was mobilized  along the line of Toldt.  The colon was transected using a GIA stapling  device distal to the perforation.  About 15 cm proximal to the  perforation the colon was also transected using a 75 mm GIA stapling  device.   The mesentery was taken down using the LigaSure, left ureter  was identified and preserved.  The abdomen was copiously irrigated with  about 12 liters of irrigation.  The circular incision was made in the  left abdomen and a cruciate incision was made in the anterior rectus  fascia.  The descending colon was brought through this for colostomy.  The fascia was closed with running #1 Novofil and #5 Ethibond retention  sutures.  Skin was closed very loosely with staples and packed,  colostomy appliance was applied.  The patient tolerated the procedure  well and went to the recovery room in good condition.      Alfonse Ras, MD  Electronically Signed    KRE/MEDQ  D:  03/16/2008  T:  03/16/2008  Job:  [id number]   cc:   Anselmo Rod, M.D.  Fax: [phone]

## 2011-03-22 NOTE — Discharge Summary (Signed)
Stephanie Ray, Stephanie Ray               ACCOUNT NO.:  [id number]   MEDICAL RECORD NO.:  [id number]          PATIENT TYPE:  INP   LOCATION:  1522                         FACILITY:  [facility]   PHYSICIAN:  Alfonse Ras, MD   DATE OF BIRTH:  October 17, 1941   DATE OF ADMISSION:  07/09/2008  DATE OF DISCHARGE:  07/14/2008                               DISCHARGE SUMMARY   ADMISSION DIAGNOSIS:  History of perforated colon and colostomy.   DISCHARGE DIAGNOSIS:  History of perforated colon and colostomy.   CONDITION ON DISCHARGE:  Good and improved.   DISPOSITION:  Discharged to home.   MEDICATIONS:  Vicodin for pain.   FOLLOW-UP:  With me 1 week after discharge.   BRIEF HISTORY OF PRESENT ILLNESS:  The patient is a 70 year old white  female who had a Hartmann resection with end colostomy in May of this  year and now presents for takedown of the colostomy and for bowel prep,  she is admitted.   HOSPITAL COURSE:  The patient was admitted.  Taken to the operating room  underwent takedown of her colostomy, which was uneventful.  She did well  postoperatively.  She remained on a PCA with Foley in place until  postoperative day #2, at which time the Foley was removed and she was  started on clear liquids.  Her diet was slowly advanced over the next 3  days and by postoperative day #5, she was ready to be discharged home.      Alfonse Ras, MD  Electronically Signed     KRE/MEDQ  D:  07/31/2008  T:  07/31/2008  Job:  [id number]

## 2011-03-22 NOTE — Procedures (Signed)
DUPLEX DEEP VENOUS EXAM - LOWER EXTREMITY   INDICATION:  Edema/pain per standing order.   HISTORY:  Edema:  Complaint of left ankle swelling for 1 month.  Trauma/Surgery:  History of left Baker's cyst removal per the patient.  Pain:  Bilateral lower leg/deep pain when standing or with pressure.  PE:  No.  Previous DVT:  No.  Anticoagulants:  Other:   DUPLEX EXAM:                CFV   SFV   PopV  PTV    GSV                R  L  R  L  R  L  R   L  R  L  Thrombosis    o  o  o  o  o  o  o   o  o  o  Spontaneous   +  +  +  +  +  +  +   +  +  +  Phasic        +  +  +  +  +  +  +   +  +  +  Augmentation  +  +  +  +  +  +  +   +  +  +  Compressible  +  +  +  +  +  +  +   +  +  +  Competent     o  o  +  +  +  +  +   +  +  +   Legend:  + - yes  o - no  p - partial  D - decreased   IMPRESSION:  1. No evidence of deep vein thrombosis noted in the bilateral lower      extremities.  2. Clinically significant reflux noted focally in the bilateral common      femoral veins.  3. There is a cystic structure measuring 3.9 x 1.3 x 1.4 cm noted in      the left medial popliteal fossa which is consistent with a Baker's      cyst.      _____________________________  James D. Lawson, M.D.   CH/MEDQ  D:  08/30/2010  T:  08/30/2010  Job:  [id number] 

## 2011-03-22 NOTE — Op Note (Signed)
Stephanie Ray, Stephanie Ray               ACCOUNT NO.:  [id number]   MEDICAL RECORD NO.:  [id number]          PATIENT TYPE:  INP   LOCATION:  1522                         FACILITY:  [facility]   PHYSICIAN:  Alfonse Ras, MD   DATE OF BIRTH:  1941/08/21   DATE OF PROCEDURE:  DATE OF DISCHARGE:                               OPERATIVE REPORT   PREOPERATIVE DIAGNOSIS:  Status post Hartmann resection with end  ileostomy.   POSTOPERATIVE DIAGNOSIS:  Status post Hartmann resection with end  ileostomy.   PROCEDURES:  Takedown of colostomy.   SURGEON:  Alfonse Ras, M.D.   ASSISTANT:  Leonie Man, M.D.   ANESTHESIA:  General.   DESCRIPTION OF PROCEDURE:  The patient was taken to the operating room  and placed in supine position.  After adequate general anesthesia was  induced, the patient was placed in lithotomy position.  Colostomy was  closed with interrupted 2-0 silk sutures.  The abdomen was prepped and  draped in normal sterile fashion.  Using the previous vertical midline  incision, I dissected down to the fascia which was opened vertically.  Previously, we placed Novafil and sutures were removed.  Peritoneum was  entered and a number of filmy adhesions were taken down.  The colostomy  was surrounded easily.  Elliptical incision was made around the  colostomy at the skin and the colostomy was reduced into the abdominal  cavity.  It was clamped.  The rectal stump was identified and mobilized  along the peritoneal edge.  This provided significant length, both on  the proximal end and on the distal end and, therefore, a side-to-side  colocolostomy was performed.  The end of the colostomy was transected  using a GIA stapling device and then laying the descending colon next to  the rectum without any tension, a side-to-side colocolostomy was  performed with a 75-mm GIA stapling device.  The defect was closed with  a 55-mm TA device.  A stitch was placed at the crotch of the  anastomosis  and had a very widely patent anastomosis.  The anastomosis itself was  covered with Tisseel.  The abdomen was copiously irrigated.  Left and  right ureters had been identified.  The colostomy fascial defect was  closed with interrupted #1 Novafil.  The midline incision was closed  with running #1 Novafil.  The skin was closed with staples.  The patient  tolerated the procedure well and went to PACU in good condition.      Alfonse Ras, MD  Electronically Signed    KRE/MEDQ  D:  07/09/2008  T:  07/09/2008  Job:  [id number]   cc:   Anselmo Rod, M.D.  Fax: [phone]

## 2011-03-25 NOTE — Discharge Summary (Signed)
Stephanie Ray, RAIDER               ACCOUNT NO.:  [id number]   MEDICAL RECORD NO.:  [id number]          PATIENT TYPE:  INP   LOCATION:  1521                         FACILITY:  [facility]   PHYSICIAN:  Alfonse Ras, MD   DATE OF BIRTH:  11-11-1940   DATE OF ADMISSION:  03/16/2008  DATE OF DISCHARGE:  03/23/2008                               DISCHARGE SUMMARY   ADMISSION DIAGNOSIS:  Perforated viscus.   DISCHARGE DIAGNOSIS:  Perforated viscus.   CONDITION ON DISCHARGE:  Good and improved.   DISPOSITION:  Discharged to home.  Follow-up is with me 1 week after  discharge.   HISTORY OF PRESENT ILLNESS:  The patient is a very pleasant 70 year old  white female who had presented to the emergency room with abdominal pain  after undergoing colonoscopy 4 days prior.  The patient did have  polypectomy at that time.  On CT scan, the patient was noted to have a  small amount of free air in the sigmoid region consistent with  perforation although the patient's exam showed minimal tenderness  because she was on steroids and was leukopenic.  It was opted to admit  her to the hospital on IV antibiotics and take her to the operating room  for exploratory laparotomy.   HOSPITAL COURSE:  She was taken to the operating room where she was  found to have a perforated sigmoid colon, and Hartmann procedure was  performed.  Postoperatively, her ileus took about 5 days to resolve.  She was maintained on IV Cipro and Flagyl and a IV steroid taper.  She  had colostomy teaching, and she was tolerating that well and on  postoperative day 6, she was ready for discharge home.   MEDICATIONS:  Medications at discharge include Vicodin.  Follow-up is  with me in 10 days after discharge.      Alfonse Ras, MD  Electronically Signed     KRE/MEDQ  D:  05/20/2008  T:  05/20/2008  Job:  [id number]   cc:   Jordan Hawks. Elnoria Howard, MD  Fax: [phone]

## 2011-08-03 LAB — BASIC METABOLIC PANEL
BUN: 5 — ABNORMAL LOW
Calcium: 9.3
Chloride: 104
GFR calc Af Amer: >60
GFR calc Af Amer: >60
GFR calc non Af Amer: >60
GFR calc non Af Amer: >60
Glucose, Bld: 136 — ABNORMAL HIGH
Potassium: 3 — ABNORMAL LOW
Potassium: 3.2 — ABNORMAL LOW
Sodium: 135
Sodium: 138

## 2011-08-10 LAB — BASIC METABOLIC PANEL
BUN: 0 — ABNORMAL LOW
CO2: 27
CO2: 27
Calcium: 10.1
Chloride: 105
Chloride: 108
Creatinine, Ser: 0.53
GFR calc Af Amer: >60
GFR calc Af Amer: >60
Potassium: 3.9
Sodium: 135

## 2011-08-10 LAB — CBC
HCT: 27.9 — ABNORMAL LOW
HCT: 28.6 — ABNORMAL LOW
Hemoglobin: 9.3 — ABNORMAL LOW
MCHC: 32.6
MCHC: 32.7
MCHC: 33.1
MCV: 82.2
MCV: 83.1
MCV: 83.8
Platelets: 365
Platelets: 409 — ABNORMAL HIGH
RBC: 3.39 — ABNORMAL LOW
RBC: 3.41 — ABNORMAL LOW
WBC: 4
WBC: 5.8
WBC: 6.2

## 2011-11-11 DIAGNOSIS — H43819 Vitreous degeneration, unspecified eye: Secondary | ICD-10-CM | POA: Diagnosis not present

## 2011-11-14 DIAGNOSIS — M2550 Pain in unspecified joint: Secondary | ICD-10-CM | POA: Diagnosis not present

## 2011-11-18 DIAGNOSIS — M2010 Hallux valgus (acquired), unspecified foot: Secondary | ICD-10-CM | POA: Diagnosis not present

## 2011-11-18 DIAGNOSIS — M79609 Pain in unspecified limb: Secondary | ICD-10-CM | POA: Diagnosis not present

## 2011-11-18 DIAGNOSIS — M899 Disorder of bone, unspecified: Secondary | ICD-10-CM | POA: Diagnosis not present

## 2011-11-18 DIAGNOSIS — M949 Disorder of cartilage, unspecified: Secondary | ICD-10-CM | POA: Diagnosis not present

## 2011-11-18 DIAGNOSIS — R700 Elevated erythrocyte sedimentation rate: Secondary | ICD-10-CM | POA: Diagnosis not present

## 2011-11-18 DIAGNOSIS — M2550 Pain in unspecified joint: Secondary | ICD-10-CM | POA: Diagnosis not present

## 2011-11-18 DIAGNOSIS — M25579 Pain in unspecified ankle and joints of unspecified foot: Secondary | ICD-10-CM | POA: Diagnosis not present

## 2011-11-18 DIAGNOSIS — M259 Joint disorder, unspecified: Secondary | ICD-10-CM | POA: Diagnosis not present

## 2011-11-25 DIAGNOSIS — J329 Chronic sinusitis, unspecified: Secondary | ICD-10-CM | POA: Diagnosis not present

## 2011-12-13 DIAGNOSIS — J329 Chronic sinusitis, unspecified: Secondary | ICD-10-CM | POA: Diagnosis not present

## 2011-12-19 DIAGNOSIS — H10019 Acute follicular conjunctivitis, unspecified eye: Secondary | ICD-10-CM | POA: Diagnosis not present

## 2011-12-20 DIAGNOSIS — M25579 Pain in unspecified ankle and joints of unspecified foot: Secondary | ICD-10-CM | POA: Diagnosis not present

## 2012-01-09 DIAGNOSIS — M2550 Pain in unspecified joint: Secondary | ICD-10-CM | POA: Diagnosis not present

## 2012-01-09 DIAGNOSIS — E559 Vitamin D deficiency, unspecified: Secondary | ICD-10-CM | POA: Diagnosis not present

## 2012-01-09 DIAGNOSIS — R7989 Other specified abnormal findings of blood chemistry: Secondary | ICD-10-CM | POA: Diagnosis not present

## 2012-01-09 DIAGNOSIS — E039 Hypothyroidism, unspecified: Secondary | ICD-10-CM | POA: Diagnosis not present

## 2012-01-16 DIAGNOSIS — M2550 Pain in unspecified joint: Secondary | ICD-10-CM | POA: Diagnosis not present

## 2012-02-06 DIAGNOSIS — M2550 Pain in unspecified joint: Secondary | ICD-10-CM | POA: Diagnosis not present

## 2012-03-13 DIAGNOSIS — Z1231 Encounter for screening mammogram for malignant neoplasm of breast: Secondary | ICD-10-CM | POA: Diagnosis not present

## 2012-03-21 DIAGNOSIS — N951 Menopausal and female climacteric states: Secondary | ICD-10-CM | POA: Diagnosis not present

## 2012-03-27 DIAGNOSIS — R7989 Other specified abnormal findings of blood chemistry: Secondary | ICD-10-CM | POA: Diagnosis not present

## 2012-03-27 DIAGNOSIS — M2550 Pain in unspecified joint: Secondary | ICD-10-CM | POA: Diagnosis not present

## 2012-03-27 DIAGNOSIS — E559 Vitamin D deficiency, unspecified: Secondary | ICD-10-CM | POA: Diagnosis not present

## 2012-03-27 DIAGNOSIS — E039 Hypothyroidism, unspecified: Secondary | ICD-10-CM | POA: Diagnosis not present

## 2012-04-11 DIAGNOSIS — M2550 Pain in unspecified joint: Secondary | ICD-10-CM | POA: Diagnosis not present

## 2012-04-23 DIAGNOSIS — M2550 Pain in unspecified joint: Secondary | ICD-10-CM | POA: Diagnosis not present

## 2012-04-24 DIAGNOSIS — M2550 Pain in unspecified joint: Secondary | ICD-10-CM | POA: Diagnosis not present

## 2012-04-30 DIAGNOSIS — M2550 Pain in unspecified joint: Secondary | ICD-10-CM | POA: Diagnosis not present

## 2012-06-05 DIAGNOSIS — M2550 Pain in unspecified joint: Secondary | ICD-10-CM | POA: Diagnosis not present

## 2012-06-26 DIAGNOSIS — E559 Vitamin D deficiency, unspecified: Secondary | ICD-10-CM | POA: Diagnosis not present

## 2012-06-26 DIAGNOSIS — R7989 Other specified abnormal findings of blood chemistry: Secondary | ICD-10-CM | POA: Diagnosis not present

## 2012-06-26 DIAGNOSIS — E039 Hypothyroidism, unspecified: Secondary | ICD-10-CM | POA: Diagnosis not present

## 2012-06-26 DIAGNOSIS — R5381 Other malaise: Secondary | ICD-10-CM | POA: Diagnosis not present

## 2012-06-26 DIAGNOSIS — M2550 Pain in unspecified joint: Secondary | ICD-10-CM | POA: Diagnosis not present

## 2012-06-26 DIAGNOSIS — R799 Abnormal finding of blood chemistry, unspecified: Secondary | ICD-10-CM | POA: Diagnosis not present

## 2012-07-02 DIAGNOSIS — Z01419 Encounter for gynecological examination (general) (routine) without abnormal findings: Secondary | ICD-10-CM | POA: Diagnosis not present

## 2012-07-05 DIAGNOSIS — M2550 Pain in unspecified joint: Secondary | ICD-10-CM | POA: Diagnosis not present

## 2012-07-19 DIAGNOSIS — R05 Cough: Secondary | ICD-10-CM | POA: Diagnosis not present

## 2012-07-26 DIAGNOSIS — R05 Cough: Secondary | ICD-10-CM | POA: Diagnosis not present

## 2012-08-16 DIAGNOSIS — M2550 Pain in unspecified joint: Secondary | ICD-10-CM | POA: Diagnosis not present

## 2012-09-03 DIAGNOSIS — M2550 Pain in unspecified joint: Secondary | ICD-10-CM | POA: Diagnosis not present

## 2012-09-17 DIAGNOSIS — H2510 Age-related nuclear cataract, unspecified eye: Secondary | ICD-10-CM | POA: Diagnosis not present

## 2012-09-28 DIAGNOSIS — M2550 Pain in unspecified joint: Secondary | ICD-10-CM | POA: Diagnosis not present

## 2012-10-10 DIAGNOSIS — R5381 Other malaise: Secondary | ICD-10-CM | POA: Diagnosis not present

## 2012-10-10 DIAGNOSIS — M2550 Pain in unspecified joint: Secondary | ICD-10-CM | POA: Diagnosis not present

## 2012-10-10 DIAGNOSIS — E559 Vitamin D deficiency, unspecified: Secondary | ICD-10-CM | POA: Diagnosis not present

## 2012-10-10 DIAGNOSIS — L272 Dermatitis due to ingested food: Secondary | ICD-10-CM | POA: Diagnosis not present

## 2012-10-10 DIAGNOSIS — R5383 Other fatigue: Secondary | ICD-10-CM | POA: Diagnosis not present

## 2012-10-10 DIAGNOSIS — R7989 Other specified abnormal findings of blood chemistry: Secondary | ICD-10-CM | POA: Diagnosis not present

## 2012-10-18 DIAGNOSIS — M2550 Pain in unspecified joint: Secondary | ICD-10-CM | POA: Diagnosis not present

## 2012-10-29 DIAGNOSIS — M2550 Pain in unspecified joint: Secondary | ICD-10-CM | POA: Diagnosis not present

## 2012-11-21 DIAGNOSIS — M1289 Other specific arthropathies, not elsewhere classified, multiple sites: Secondary | ICD-10-CM | POA: Diagnosis not present

## 2012-11-27 DIAGNOSIS — M069 Rheumatoid arthritis, unspecified: Secondary | ICD-10-CM | POA: Diagnosis not present

## 2012-11-27 DIAGNOSIS — M2550 Pain in unspecified joint: Secondary | ICD-10-CM | POA: Diagnosis not present

## 2012-12-07 DIAGNOSIS — M2550 Pain in unspecified joint: Secondary | ICD-10-CM | POA: Diagnosis not present

## 2013-01-28 DIAGNOSIS — M2550 Pain in unspecified joint: Secondary | ICD-10-CM | POA: Diagnosis not present

## 2013-01-31 ENCOUNTER — Encounter (HOSPITAL_COMMUNITY): Payer: Self-pay | Admitting: *Deleted

## 2013-01-31 ENCOUNTER — Emergency Department (HOSPITAL_COMMUNITY)
Admission: EM | Admit: 2013-01-31 | Discharge: 2013-01-31 | Disposition: A | Discharge status: Home / Self Care | Payer: Medicare Other | Attending: Emergency Medicine | Admitting: Emergency Medicine

## 2013-01-31 DIAGNOSIS — G5600 Carpal tunnel syndrome, unspecified upper limb: Secondary | ICD-10-CM | POA: Insufficient documentation

## 2013-01-31 DIAGNOSIS — F411 Generalized anxiety disorder: Secondary | ICD-10-CM | POA: Diagnosis not present

## 2013-01-31 DIAGNOSIS — Z862 Personal history of diseases of the blood and blood-forming organs and certain disorders involving the immune mechanism: Secondary | ICD-10-CM | POA: Insufficient documentation

## 2013-01-31 DIAGNOSIS — E785 Hyperlipidemia, unspecified: Secondary | ICD-10-CM | POA: Diagnosis not present

## 2013-01-31 DIAGNOSIS — Z860100 Personal history of colon polyps, unspecified: Secondary | ICD-10-CM | POA: Insufficient documentation

## 2013-01-31 DIAGNOSIS — J309 Allergic rhinitis, unspecified: Secondary | ICD-10-CM | POA: Diagnosis not present

## 2013-01-31 DIAGNOSIS — M316 Other giant cell arteritis: Secondary | ICD-10-CM | POA: Insufficient documentation

## 2013-01-31 DIAGNOSIS — F329 Major depressive disorder, single episode, unspecified: Secondary | ICD-10-CM | POA: Insufficient documentation

## 2013-01-31 DIAGNOSIS — R5381 Other malaise: Secondary | ICD-10-CM | POA: Diagnosis not present

## 2013-01-31 DIAGNOSIS — IMO0001 Reserved for inherently not codable concepts without codable children: Secondary | ICD-10-CM | POA: Diagnosis not present

## 2013-01-31 DIAGNOSIS — F3289 Other specified depressive episodes: Secondary | ICD-10-CM | POA: Insufficient documentation

## 2013-01-31 DIAGNOSIS — R21 Rash and other nonspecific skin eruption: Secondary | ICD-10-CM | POA: Insufficient documentation

## 2013-01-31 DIAGNOSIS — Z87440 Personal history of urinary (tract) infections: Secondary | ICD-10-CM | POA: Diagnosis not present

## 2013-01-31 DIAGNOSIS — G7089 Other specified myoneural disorders: Secondary | ICD-10-CM | POA: Diagnosis not present

## 2013-01-31 DIAGNOSIS — Z8719 Personal history of other diseases of the digestive system: Secondary | ICD-10-CM | POA: Insufficient documentation

## 2013-01-31 DIAGNOSIS — R05 Cough: Secondary | ICD-10-CM | POA: Insufficient documentation

## 2013-01-31 DIAGNOSIS — Z79899 Other long term (current) drug therapy: Secondary | ICD-10-CM | POA: Insufficient documentation

## 2013-01-31 DIAGNOSIS — R059 Cough, unspecified: Secondary | ICD-10-CM | POA: Diagnosis not present

## 2013-01-31 DIAGNOSIS — M129 Arthropathy, unspecified: Secondary | ICD-10-CM | POA: Insufficient documentation

## 2013-01-31 DIAGNOSIS — R5383 Other fatigue: Secondary | ICD-10-CM | POA: Diagnosis not present

## 2013-01-31 HISTORY — DX: Perforation of intestine (nontraumatic): K63.1

## 2013-01-31 HISTORY — DX: Unspecified osteoarthritis, unspecified site: M19.90

## 2013-01-31 LAB — COMPREHENSIVE METABOLIC PANEL
ALT: 7 U/L (ref 0–35)
AST: 13 U/L (ref 0–37)
Alkaline Phosphatase: 85 U/L (ref 39–117)
CO2: 26 mEq/L (ref 19–32)
Chloride: 103 mEq/L (ref 96–112)
GFR calc non Af Amer: 82 mL/min — ABNORMAL LOW (ref >90)
Glucose, Bld: 89 mg/dL (ref 70–99)
Potassium: 4.1 mEq/L (ref 3.5–5.1)
Sodium: 138 mEq/L (ref 135–145)
Total Bilirubin: 0.2 mg/dL — ABNORMAL LOW (ref 0.3–1.2)

## 2013-01-31 LAB — CBC WITH DIFFERENTIAL/PLATELET
Basophils Absolute: 0 10*3/uL (ref 0.0–0.1)
Lymphocytes Relative: 28 % (ref 12–46)
Lymphs Abs: 1.5 10*3/uL (ref 0.7–4.0)
Neutro Abs: 3.2 10*3/uL (ref 1.7–7.7)
Platelets: 319 10*3/uL (ref 150–400)
RBC: 3.83 MIL/uL — ABNORMAL LOW (ref 3.87–5.11)
RDW: 14.3 % (ref 11.5–15.5)
WBC: 5.3 10*3/uL (ref 4.0–10.5)

## 2013-01-31 MED ORDER — PREDNISONE 20 MG PO TABS
60.0000 mg | ORAL_TABLET | Freq: Once | ORAL | Status: AC
  Administered 2013-01-31: 60 mg via ORAL
  Filled 2013-01-31: qty 3

## 2013-01-31 MED ORDER — PREDNISONE 20 MG PO TABS: 60.0000 mg | ORAL_TABLET | Freq: Every day | ORAL | Status: DC

## 2013-01-31 NOTE — ED Notes (Signed)
MD at bedside. 

## 2013-01-31 NOTE — ED Provider Notes (Signed)
History     CSN: [id number]  Arrival date & time 01/31/13  1353   First MD Initiated Contact with Patient 01/31/13 1606      Chief Complaint  Patient presents with  . Jaw Pain  . Blurred Vision    (Consider location/radiation/quality/duration/timing/severity/associated sxs/prior treatment) HPI Comments: Pt has long hx of arthritis, body aches and possible vasculitis.  She has now been off prednisone for 1 year but states over the last 3 weeks she has had worsening body/joint aches and pain in the left jaw with worsening bilateral blurry vision.  She states she can't sleep at night due to the body aches but denies fever or resp issues.  She has had temporal artery biopsy in the past bilaterally without findings but was on prednisone at that time.  She states always has some blurry vision but seems worse now.  Pt denies N/V, chest pain or abd pain.  No peripheral edema but states will occasionally get rashes on various parts of her body which will come and go but no rashes today. Pt states that she saw her PCP within the last 3 weeks and states they did not know why she was having these sx.  She spoke with her rheumatologist Dr. Clovis Riley today and she suggested she be r/o for giant cell arteritis.  The history is provided by the patient.    Past Medical History  Diagnosis Date  . ALLERGIC RHINITIS   . Hyperlipidemia   . Cough     chronic  . Carpal tunnel syndrome     right  . Anemia   . Anxiety   . Colon polyps     adenomatous  . Depression   . Diverticulitis   . UTI (lower urinary tract infection)   . Arthritis   . Rupture of transverse colon     Past Surgical History  Procedure Laterality Date  . Back surgery  1984  . Breast cyst excision    . Meniscus repair      left knee torn  . Herniated disc    . Colostomy    . Colostomy reversal      Family History  Problem Relation Age of Onset  . Cancer Mother     breast  . Colon polyps Brother     x 3  . Heart  disease Father     History  Substance Use Topics  . Smoking status: Never Smoker   . Smokeless tobacco: Never Used  . Alcohol Use: No    OB History   Grav Para Term Preterm Abortions TAB SAB Ect Mult Living                  Review of Systems  Constitutional: Positive for fatigue. Negative for fever.  Respiratory: Negative for cough and shortness of breath.   Cardiovascular: Negative for chest pain and leg swelling.  Musculoskeletal: Positive for myalgias and arthralgias.  Skin: Positive for rash.  All other systems reviewed and are negative.    Allergies  Codeine; Erythromycin; Ketorolac; Morphine; Naproxen sodium; Penicillins; and Sulfonamide derivatives  Home Medications   Current Outpatient Rx  Name  Route  Sig  Dispense  Refill  . acetaminophen (TYLENOL) 500 MG tablet   Oral   Take 500 mg by mouth every 6 (six) hours as needed.           . calcium carbonate (OS-CAL) 600 MG TABS   Oral   Take 600 mg by mouth daily.           Marland Kitchen  calcium carbonate (TUMS EX) 750 MG chewable tablet   Oral   Chew 1 tablet by mouth 2 (two) times daily.           . cyanocobalamin (,VITAMIN B-12,) 1000 MCG/ML injection      Two injections weekly          . Cyanocobalamin (BL VITAMIN B-12 PO)   Oral   Take 1 tablet by mouth every other day.           . docusate sodium (COLACE) 100 MG capsule      Take 1-2 times weekly          . Dulse Flakes POWD   Does not apply   by Does not apply route.           . hydroxychloroquine (PLAQUENIL) 200 MG tablet   Oral   Take by mouth 2 (two) times daily.           . milk thistle 175 MG tablet   Oral   Take 175 mg by mouth daily.          . Multiple Vitamin (MULTIVITAMIN PO)   Oral   Take 1 tablet by mouth 2 (two) times daily.           . NON FORMULARY      Isocort-2 tablets twice daily          . NON FORMULARY      Progesterone SR 25mg - twice daily          . NON FORMULARY      Ambertose 2tablets  twice daily          . NON FORMULARY      Naltrexone 3 mg 1 at bedtime          . Probiotic Product (FLORA-Q PO)      Three tabs By mouth daily          . VITAMIN D, ERGOCALCIFEROL, PO   Oral   Take 5,000 Units by mouth daily. Take one by mouth twice daily            BP 139/75  Pulse 83  Temp(Src) 97.7 F (36.5 C) (Oral)  Resp 18  SpO2 100%  Physical Exam  Nursing note and vitals reviewed. Constitutional: She is oriented to person, place, and time. She appears well-developed and well-nourished. No distress.  HENT:  Head: Normocephalic and atraumatic.  Right Ear: Tympanic membrane and ear canal normal.  Left Ear: Tympanic membrane and ear canal normal.  Mouth/Throat: Oropharynx is clear and moist.  Eyes: Conjunctivae, EOM and lids are normal. Pupils are equal, round, and reactive to light. No foreign bodies found. Right eye exhibits no chemosis and no discharge. Left eye exhibits no chemosis and no discharge. Pupils are equal.  Neck: Normal range of motion. Neck supple.  Cardiovascular: Normal rate, regular rhythm and intact distal pulses.   No murmur heard. Pulmonary/Chest: Effort normal and breath sounds normal. No respiratory distress. She has no wheezes. She has no rales.  Abdominal: Soft. She exhibits no distension. There is no tenderness. There is no rebound and no guarding.  Musculoskeletal: Normal range of motion. She exhibits tenderness. She exhibits no edema.  Pain with palpation over the upper and lower ext.  No joint swelling visualized.  Pain with palpation over the left TMJ but no temporal artery pain.  Neurological: She is alert and oriented to person, place, and time.  Skin: Skin is warm and dry. No rash noted.  No erythema.  Psychiatric: She has a normal mood and affect. Her behavior is normal.    ED Course  Procedures (including critical care time)  Labs Reviewed  CBC WITH DIFFERENTIAL - Abnormal; Notable for the following:    RBC 3.83 (*)     Hemoglobin 11.4 (*)    HCT 33.7 (*)    All other components within normal limits  COMPREHENSIVE METABOLIC PANEL - Abnormal; Notable for the following:    Albumin 3.3 (*)    Total Bilirubin 0.2 (*)    GFR calc non Af Amer 82 (*)    All other components within normal limits  SEDIMENTATION RATE - Abnormal; Notable for the following:    Sed Rate 68 (*)    All other components within normal limits  CK  C-REACTIVE PROTEIN   No results found.   1. Giant cell arteritis syndrome       MDM   Patient here for multiple complaints including diffuse body aches, joint pain, left TMJ pain and bilateral blurry vision. Patient states she's had issues for some time and had been on prednisone for over a year for assumed possible vasculitis but had negative temporal artery biopsies. Patient states she's been off prednisone for the last year. Because her symptoms are worsened over the last 3 weeks she spoke with her rheumatologist today Dr. Clovis Riley who recommended she come to the emergency room for further evaluation. She has seen her PCP within the last 3 weeks and she states they do weren't sure what was going on with her. She denies any recent medication changes, fevers but states occasionally she will get a rash but then resolves. She has no rashes on exam today and no temporal artery pain. Her jaws and her TMJ. Ears are normal vital signs are normal and other than diffuse pain when touching her body anywhere her exam is normal.  We'll attempt to get a hold of Dr. Clovis Riley is I'm not able to pull up her outside records for further guidance. CBC, CMP, CK, sedimentation rate, CRP pending  5:29 PM Spoke with Dr. Clovis Riley who feels pt needs to see an ophthamologist tomorrow.  Labs showed elevation of sed rate at 68.  Dr. Clovis Riley felt pt would need to start prednisone at 60 and f/u with ophtho tomorrow spoke with Dr. Maple Hudson.      Gwyneth Sprout, MD 01/31/13 [phone]

## 2013-01-31 NOTE — ED Notes (Signed)
Pt was sent here by her rheumatologist Sigurd Sos @ [facility]) for r/o giant cell arteritis.  Pt states she has been having increasing blurred vision and jaw pain and is unable to sleep d/t the pain.  So she called her MD and she stated to come here.

## 2013-01-31 NOTE — ED Notes (Signed)
Checked patient eyes 20/40 right eye and 20/40 left patient was seeing double letters 20/40 both eyes

## 2013-02-01 DIAGNOSIS — M316 Other giant cell arteritis: Secondary | ICD-10-CM | POA: Diagnosis not present

## 2013-02-01 LAB — C-REACTIVE PROTEIN: CRP: 2.1 mg/dL — ABNORMAL HIGH (ref <0.60)

## 2013-02-04 DIAGNOSIS — Z79899 Other long term (current) drug therapy: Secondary | ICD-10-CM | POA: Diagnosis not present

## 2013-02-04 DIAGNOSIS — R51 Headache: Secondary | ICD-10-CM | POA: Diagnosis not present

## 2013-02-04 DIAGNOSIS — M064 Inflammatory polyarthropathy: Secondary | ICD-10-CM | POA: Diagnosis not present

## 2013-02-05 DIAGNOSIS — H2510 Age-related nuclear cataract, unspecified eye: Secondary | ICD-10-CM | POA: Diagnosis not present

## 2013-02-05 DIAGNOSIS — H47019 Ischemic optic neuropathy, unspecified eye: Secondary | ICD-10-CM | POA: Diagnosis not present

## 2013-02-08 DIAGNOSIS — M2550 Pain in unspecified joint: Secondary | ICD-10-CM | POA: Diagnosis not present

## 2013-02-14 DIAGNOSIS — I776 Arteritis, unspecified: Secondary | ICD-10-CM | POA: Diagnosis not present

## 2013-02-14 DIAGNOSIS — M7730 Calcaneal spur, unspecified foot: Secondary | ICD-10-CM | POA: Diagnosis not present

## 2013-02-14 DIAGNOSIS — M130 Polyarthritis, unspecified: Secondary | ICD-10-CM | POA: Diagnosis not present

## 2013-02-14 DIAGNOSIS — M216X9 Other acquired deformities of unspecified foot: Secondary | ICD-10-CM | POA: Diagnosis not present

## 2013-02-14 DIAGNOSIS — G63 Polyneuropathy in diseases classified elsewhere: Secondary | ICD-10-CM | POA: Diagnosis not present

## 2013-02-14 DIAGNOSIS — M259 Joint disorder, unspecified: Secondary | ICD-10-CM | POA: Diagnosis not present

## 2013-02-14 DIAGNOSIS — R700 Elevated erythrocyte sedimentation rate: Secondary | ICD-10-CM | POA: Diagnosis not present

## 2013-02-14 DIAGNOSIS — M2010 Hallux valgus (acquired), unspecified foot: Secondary | ICD-10-CM | POA: Diagnosis not present

## 2013-02-21 DIAGNOSIS — E559 Vitamin D deficiency, unspecified: Secondary | ICD-10-CM | POA: Diagnosis not present

## 2013-02-21 DIAGNOSIS — G63 Polyneuropathy in diseases classified elsewhere: Secondary | ICD-10-CM | POA: Diagnosis not present

## 2013-02-21 DIAGNOSIS — M064 Inflammatory polyarthropathy: Secondary | ICD-10-CM | POA: Diagnosis not present

## 2013-02-21 DIAGNOSIS — I776 Arteritis, unspecified: Secondary | ICD-10-CM | POA: Diagnosis not present

## 2013-02-21 DIAGNOSIS — Z79899 Other long term (current) drug therapy: Secondary | ICD-10-CM | POA: Diagnosis not present

## 2013-02-21 DIAGNOSIS — M130 Polyarthritis, unspecified: Secondary | ICD-10-CM | POA: Diagnosis not present

## 2013-02-21 DIAGNOSIS — R700 Elevated erythrocyte sedimentation rate: Secondary | ICD-10-CM | POA: Diagnosis not present

## 2013-02-21 DIAGNOSIS — G609 Hereditary and idiopathic neuropathy, unspecified: Secondary | ICD-10-CM | POA: Diagnosis not present

## 2013-02-27 ENCOUNTER — Ambulatory Visit
Admission: RE | Admit: 2013-02-27 | Discharge: 2013-02-27 | Disposition: A | Discharge status: Home / Self Care | Payer: Medicare Other | Source: Ambulatory Visit | Attending: Surgery | Admitting: Surgery

## 2013-02-27 ENCOUNTER — Other Ambulatory Visit: Payer: Self-pay | Admitting: Surgery

## 2013-02-27 DIAGNOSIS — Z01818 Encounter for other preprocedural examination: Secondary | ICD-10-CM | POA: Diagnosis not present

## 2013-02-27 DIAGNOSIS — E039 Hypothyroidism, unspecified: Secondary | ICD-10-CM | POA: Diagnosis not present

## 2013-02-27 DIAGNOSIS — R05 Cough: Secondary | ICD-10-CM | POA: Diagnosis not present

## 2013-02-27 DIAGNOSIS — Z111 Encounter for screening for respiratory tuberculosis: Secondary | ICD-10-CM | POA: Diagnosis not present

## 2013-02-27 DIAGNOSIS — R0989 Other specified symptoms and signs involving the circulatory and respiratory systems: Secondary | ICD-10-CM | POA: Diagnosis not present

## 2013-03-06 DIAGNOSIS — R7989 Other specified abnormal findings of blood chemistry: Secondary | ICD-10-CM | POA: Diagnosis not present

## 2013-03-06 DIAGNOSIS — R5381 Other malaise: Secondary | ICD-10-CM | POA: Diagnosis not present

## 2013-03-06 DIAGNOSIS — E039 Hypothyroidism, unspecified: Secondary | ICD-10-CM | POA: Diagnosis not present

## 2013-03-06 DIAGNOSIS — M359 Systemic involvement of connective tissue, unspecified: Secondary | ICD-10-CM | POA: Diagnosis not present

## 2013-03-06 DIAGNOSIS — E559 Vitamin D deficiency, unspecified: Secondary | ICD-10-CM | POA: Diagnosis not present

## 2013-03-19 DIAGNOSIS — M2550 Pain in unspecified joint: Secondary | ICD-10-CM | POA: Diagnosis not present

## 2013-03-29 DIAGNOSIS — M542 Cervicalgia: Secondary | ICD-10-CM | POA: Diagnosis not present

## 2013-03-29 DIAGNOSIS — Z79899 Other long term (current) drug therapy: Secondary | ICD-10-CM | POA: Diagnosis not present

## 2013-03-29 DIAGNOSIS — M23205 Derangement of unspecified medial meniscus due to old tear or injury, unspecified knee: Secondary | ICD-10-CM | POA: Diagnosis not present

## 2013-03-29 DIAGNOSIS — M069 Rheumatoid arthritis, unspecified: Secondary | ICD-10-CM | POA: Diagnosis not present

## 2013-04-02 DIAGNOSIS — M069 Rheumatoid arthritis, unspecified: Secondary | ICD-10-CM | POA: Diagnosis not present

## 2013-04-02 DIAGNOSIS — Z79899 Other long term (current) drug therapy: Secondary | ICD-10-CM | POA: Diagnosis not present

## 2013-04-15 DIAGNOSIS — Z79899 Other long term (current) drug therapy: Secondary | ICD-10-CM | POA: Diagnosis not present

## 2013-04-15 DIAGNOSIS — M069 Rheumatoid arthritis, unspecified: Secondary | ICD-10-CM | POA: Diagnosis not present

## 2013-04-15 DIAGNOSIS — D649 Anemia, unspecified: Secondary | ICD-10-CM | POA: Diagnosis not present

## 2013-04-23 DIAGNOSIS — M2550 Pain in unspecified joint: Secondary | ICD-10-CM | POA: Diagnosis not present

## 2013-04-30 DIAGNOSIS — Z79899 Other long term (current) drug therapy: Secondary | ICD-10-CM | POA: Diagnosis not present

## 2013-04-30 DIAGNOSIS — M069 Rheumatoid arthritis, unspecified: Secondary | ICD-10-CM | POA: Diagnosis not present

## 2013-05-07 DIAGNOSIS — H2510 Age-related nuclear cataract, unspecified eye: Secondary | ICD-10-CM | POA: Diagnosis not present

## 2013-05-14 DIAGNOSIS — G609 Hereditary and idiopathic neuropathy, unspecified: Secondary | ICD-10-CM | POA: Diagnosis not present

## 2013-05-14 DIAGNOSIS — I6789 Other cerebrovascular disease: Secondary | ICD-10-CM | POA: Diagnosis not present

## 2013-05-14 DIAGNOSIS — R700 Elevated erythrocyte sedimentation rate: Secondary | ICD-10-CM | POA: Diagnosis not present

## 2013-05-17 DIAGNOSIS — H2510 Age-related nuclear cataract, unspecified eye: Secondary | ICD-10-CM | POA: Diagnosis not present

## 2013-05-23 DIAGNOSIS — I6789 Other cerebrovascular disease: Secondary | ICD-10-CM | POA: Diagnosis not present

## 2013-05-23 DIAGNOSIS — G589 Mononeuropathy, unspecified: Secondary | ICD-10-CM | POA: Diagnosis not present

## 2013-05-23 DIAGNOSIS — I776 Arteritis, unspecified: Secondary | ICD-10-CM | POA: Diagnosis not present

## 2013-05-23 DIAGNOSIS — I679 Cerebrovascular disease, unspecified: Secondary | ICD-10-CM | POA: Diagnosis not present

## 2013-05-28 DIAGNOSIS — Z79899 Other long term (current) drug therapy: Secondary | ICD-10-CM | POA: Diagnosis not present

## 2013-05-28 DIAGNOSIS — M069 Rheumatoid arthritis, unspecified: Secondary | ICD-10-CM | POA: Diagnosis not present

## 2013-06-12 ENCOUNTER — Other Ambulatory Visit: Payer: Self-pay

## 2013-06-12 DIAGNOSIS — M2550 Pain in unspecified joint: Secondary | ICD-10-CM | POA: Diagnosis not present

## 2013-06-18 DIAGNOSIS — Z79899 Other long term (current) drug therapy: Secondary | ICD-10-CM | POA: Diagnosis not present

## 2013-06-18 DIAGNOSIS — M23205 Derangement of unspecified medial meniscus due to old tear or injury, unspecified knee: Secondary | ICD-10-CM | POA: Diagnosis not present

## 2013-06-20 DIAGNOSIS — H1130 Conjunctival hemorrhage, unspecified eye: Secondary | ICD-10-CM | POA: Diagnosis not present

## 2013-06-24 DIAGNOSIS — H269 Unspecified cataract: Secondary | ICD-10-CM | POA: Diagnosis not present

## 2013-06-24 DIAGNOSIS — H2589 Other age-related cataract: Secondary | ICD-10-CM | POA: Diagnosis not present

## 2013-06-24 DIAGNOSIS — H2510 Age-related nuclear cataract, unspecified eye: Secondary | ICD-10-CM | POA: Diagnosis not present

## 2013-07-01 DIAGNOSIS — E782 Mixed hyperlipidemia: Secondary | ICD-10-CM | POA: Diagnosis not present

## 2013-07-01 DIAGNOSIS — E039 Hypothyroidism, unspecified: Secondary | ICD-10-CM | POA: Diagnosis not present

## 2013-07-09 DIAGNOSIS — Z13820 Encounter for screening for osteoporosis: Secondary | ICD-10-CM | POA: Diagnosis not present

## 2013-07-09 DIAGNOSIS — Z1231 Encounter for screening mammogram for malignant neoplasm of breast: Secondary | ICD-10-CM | POA: Diagnosis not present

## 2013-07-09 DIAGNOSIS — M810 Age-related osteoporosis without current pathological fracture: Secondary | ICD-10-CM | POA: Diagnosis not present

## 2013-07-09 DIAGNOSIS — M899 Disorder of bone, unspecified: Secondary | ICD-10-CM | POA: Diagnosis not present

## 2013-07-09 DIAGNOSIS — Z01419 Encounter for gynecological examination (general) (routine) without abnormal findings: Secondary | ICD-10-CM | POA: Diagnosis not present

## 2013-07-16 DIAGNOSIS — M069 Rheumatoid arthritis, unspecified: Secondary | ICD-10-CM | POA: Diagnosis not present

## 2013-07-24 DIAGNOSIS — E279 Disorder of adrenal gland, unspecified: Secondary | ICD-10-CM | POA: Diagnosis not present

## 2013-08-15 DIAGNOSIS — Z79899 Other long term (current) drug therapy: Secondary | ICD-10-CM | POA: Diagnosis not present

## 2013-08-15 DIAGNOSIS — M069 Rheumatoid arthritis, unspecified: Secondary | ICD-10-CM | POA: Diagnosis not present

## 2013-08-27 DIAGNOSIS — M069 Rheumatoid arthritis, unspecified: Secondary | ICD-10-CM | POA: Diagnosis not present

## 2013-09-12 ENCOUNTER — Other Ambulatory Visit: Payer: Self-pay

## 2013-09-16 DIAGNOSIS — M069 Rheumatoid arthritis, unspecified: Secondary | ICD-10-CM | POA: Diagnosis not present

## 2013-09-16 DIAGNOSIS — Z79899 Other long term (current) drug therapy: Secondary | ICD-10-CM | POA: Diagnosis not present

## 2013-09-25 DIAGNOSIS — M2550 Pain in unspecified joint: Secondary | ICD-10-CM | POA: Diagnosis not present

## 2013-10-16 DIAGNOSIS — M069 Rheumatoid arthritis, unspecified: Secondary | ICD-10-CM | POA: Diagnosis not present

## 2013-10-16 DIAGNOSIS — Z79899 Other long term (current) drug therapy: Secondary | ICD-10-CM | POA: Diagnosis not present

## 2013-10-27 DIAGNOSIS — M810 Age-related osteoporosis without current pathological fracture: Secondary | ICD-10-CM | POA: Diagnosis not present

## 2013-10-27 DIAGNOSIS — N951 Menopausal and female climacteric states: Secondary | ICD-10-CM | POA: Diagnosis not present

## 2013-11-18 DIAGNOSIS — M069 Rheumatoid arthritis, unspecified: Secondary | ICD-10-CM | POA: Diagnosis not present

## 2013-11-18 DIAGNOSIS — Z79899 Other long term (current) drug therapy: Secondary | ICD-10-CM | POA: Diagnosis not present

## 2013-11-26 DIAGNOSIS — Z79899 Other long term (current) drug therapy: Secondary | ICD-10-CM | POA: Diagnosis not present

## 2013-11-26 DIAGNOSIS — R5381 Other malaise: Secondary | ICD-10-CM | POA: Diagnosis not present

## 2013-11-26 DIAGNOSIS — E162 Hypoglycemia, unspecified: Secondary | ICD-10-CM | POA: Diagnosis not present

## 2013-11-26 DIAGNOSIS — R5383 Other fatigue: Secondary | ICD-10-CM | POA: Diagnosis not present

## 2013-11-26 DIAGNOSIS — IMO0001 Reserved for inherently not codable concepts without codable children: Secondary | ICD-10-CM | POA: Diagnosis not present

## 2013-11-26 DIAGNOSIS — G988 Other disorders of nervous system: Secondary | ICD-10-CM | POA: Diagnosis not present

## 2013-12-11 DIAGNOSIS — M2550 Pain in unspecified joint: Secondary | ICD-10-CM | POA: Diagnosis not present

## 2013-12-17 DIAGNOSIS — H2510 Age-related nuclear cataract, unspecified eye: Secondary | ICD-10-CM | POA: Diagnosis not present

## 2013-12-19 DIAGNOSIS — M069 Rheumatoid arthritis, unspecified: Secondary | ICD-10-CM | POA: Diagnosis not present

## 2013-12-19 DIAGNOSIS — Z79899 Other long term (current) drug therapy: Secondary | ICD-10-CM | POA: Diagnosis not present

## 2014-01-08 DIAGNOSIS — H2510 Age-related nuclear cataract, unspecified eye: Secondary | ICD-10-CM | POA: Diagnosis not present

## 2014-01-16 DIAGNOSIS — M069 Rheumatoid arthritis, unspecified: Secondary | ICD-10-CM | POA: Diagnosis not present

## 2014-01-16 DIAGNOSIS — Z79899 Other long term (current) drug therapy: Secondary | ICD-10-CM | POA: Diagnosis not present

## 2014-01-20 DIAGNOSIS — Z79899 Other long term (current) drug therapy: Secondary | ICD-10-CM | POA: Diagnosis not present

## 2014-01-20 DIAGNOSIS — M069 Rheumatoid arthritis, unspecified: Secondary | ICD-10-CM | POA: Diagnosis not present

## 2014-01-30 DIAGNOSIS — R5381 Other malaise: Secondary | ICD-10-CM | POA: Diagnosis not present

## 2014-01-30 DIAGNOSIS — M2550 Pain in unspecified joint: Secondary | ICD-10-CM | POA: Diagnosis not present

## 2014-01-30 DIAGNOSIS — IMO0001 Reserved for inherently not codable concepts without codable children: Secondary | ICD-10-CM | POA: Diagnosis not present

## 2014-01-30 DIAGNOSIS — R05 Cough: Secondary | ICD-10-CM | POA: Diagnosis not present

## 2014-01-30 DIAGNOSIS — R059 Cough, unspecified: Secondary | ICD-10-CM | POA: Diagnosis not present

## 2014-01-30 DIAGNOSIS — G589 Mononeuropathy, unspecified: Secondary | ICD-10-CM | POA: Diagnosis not present

## 2014-01-30 DIAGNOSIS — M069 Rheumatoid arthritis, unspecified: Secondary | ICD-10-CM | POA: Diagnosis not present

## 2014-02-03 DIAGNOSIS — H2510 Age-related nuclear cataract, unspecified eye: Secondary | ICD-10-CM | POA: Diagnosis not present

## 2014-02-03 DIAGNOSIS — H2589 Other age-related cataract: Secondary | ICD-10-CM | POA: Diagnosis not present

## 2014-02-03 DIAGNOSIS — H269 Unspecified cataract: Secondary | ICD-10-CM | POA: Diagnosis not present

## 2014-02-06 DIAGNOSIS — J019 Acute sinusitis, unspecified: Secondary | ICD-10-CM | POA: Diagnosis not present

## 2014-02-27 DIAGNOSIS — M2550 Pain in unspecified joint: Secondary | ICD-10-CM | POA: Diagnosis not present

## 2014-02-27 DIAGNOSIS — R5381 Other malaise: Secondary | ICD-10-CM | POA: Diagnosis not present

## 2014-02-27 DIAGNOSIS — G589 Mononeuropathy, unspecified: Secondary | ICD-10-CM | POA: Diagnosis not present

## 2014-02-27 DIAGNOSIS — R5383 Other fatigue: Secondary | ICD-10-CM | POA: Diagnosis not present

## 2014-02-27 DIAGNOSIS — M069 Rheumatoid arthritis, unspecified: Secondary | ICD-10-CM | POA: Diagnosis not present

## 2014-03-15 DIAGNOSIS — B029 Zoster without complications: Secondary | ICD-10-CM | POA: Diagnosis not present

## 2014-03-15 DIAGNOSIS — R21 Rash and other nonspecific skin eruption: Secondary | ICD-10-CM | POA: Diagnosis not present

## 2014-04-14 DIAGNOSIS — B029 Zoster without complications: Secondary | ICD-10-CM | POA: Diagnosis not present

## 2014-04-14 DIAGNOSIS — L989 Disorder of the skin and subcutaneous tissue, unspecified: Secondary | ICD-10-CM | POA: Diagnosis not present

## 2014-04-29 DIAGNOSIS — M2550 Pain in unspecified joint: Secondary | ICD-10-CM | POA: Diagnosis not present

## 2014-04-29 DIAGNOSIS — G589 Mononeuropathy, unspecified: Secondary | ICD-10-CM | POA: Diagnosis not present

## 2014-04-29 DIAGNOSIS — M069 Rheumatoid arthritis, unspecified: Secondary | ICD-10-CM | POA: Diagnosis not present

## 2014-04-29 DIAGNOSIS — R5381 Other malaise: Secondary | ICD-10-CM | POA: Diagnosis not present

## 2014-04-29 DIAGNOSIS — Z23 Encounter for immunization: Secondary | ICD-10-CM | POA: Diagnosis not present

## 2014-04-29 DIAGNOSIS — R5383 Other fatigue: Secondary | ICD-10-CM | POA: Diagnosis not present

## 2014-05-19 DIAGNOSIS — J309 Allergic rhinitis, unspecified: Secondary | ICD-10-CM | POA: Diagnosis not present

## 2014-05-19 DIAGNOSIS — E039 Hypothyroidism, unspecified: Secondary | ICD-10-CM | POA: Diagnosis not present

## 2014-05-19 DIAGNOSIS — R51 Headache: Secondary | ICD-10-CM | POA: Diagnosis not present

## 2014-05-19 DIAGNOSIS — E559 Vitamin D deficiency, unspecified: Secondary | ICD-10-CM | POA: Diagnosis not present

## 2014-06-03 DIAGNOSIS — E039 Hypothyroidism, unspecified: Secondary | ICD-10-CM | POA: Diagnosis not present

## 2014-06-03 DIAGNOSIS — E559 Vitamin D deficiency, unspecified: Secondary | ICD-10-CM | POA: Diagnosis not present

## 2014-06-03 DIAGNOSIS — M069 Rheumatoid arthritis, unspecified: Secondary | ICD-10-CM | POA: Diagnosis not present

## 2014-06-17 DIAGNOSIS — M069 Rheumatoid arthritis, unspecified: Secondary | ICD-10-CM | POA: Diagnosis not present

## 2014-06-25 DIAGNOSIS — I509 Heart failure, unspecified: Secondary | ICD-10-CM | POA: Diagnosis not present

## 2014-06-25 DIAGNOSIS — E559 Vitamin D deficiency, unspecified: Secondary | ICD-10-CM | POA: Diagnosis not present

## 2014-06-25 DIAGNOSIS — E785 Hyperlipidemia, unspecified: Secondary | ICD-10-CM | POA: Diagnosis not present

## 2014-06-25 DIAGNOSIS — E039 Hypothyroidism, unspecified: Secondary | ICD-10-CM | POA: Diagnosis not present

## 2014-07-17 DIAGNOSIS — M069 Rheumatoid arthritis, unspecified: Secondary | ICD-10-CM | POA: Diagnosis not present

## 2014-07-30 ENCOUNTER — Other Ambulatory Visit: Payer: Self-pay | Admitting: Obstetrics and Gynecology

## 2014-07-30 DIAGNOSIS — Z1231 Encounter for screening mammogram for malignant neoplasm of breast: Secondary | ICD-10-CM | POA: Diagnosis not present

## 2014-07-30 DIAGNOSIS — Z124 Encounter for screening for malignant neoplasm of cervix: Secondary | ICD-10-CM | POA: Diagnosis not present

## 2014-07-31 LAB — CYTOLOGY - PAP

## 2014-08-01 DIAGNOSIS — M2550 Pain in unspecified joint: Secondary | ICD-10-CM | POA: Diagnosis not present

## 2014-08-01 DIAGNOSIS — R5383 Other fatigue: Secondary | ICD-10-CM | POA: Diagnosis not present

## 2014-08-01 DIAGNOSIS — G589 Mononeuropathy, unspecified: Secondary | ICD-10-CM | POA: Diagnosis not present

## 2014-08-01 DIAGNOSIS — M069 Rheumatoid arthritis, unspecified: Secondary | ICD-10-CM | POA: Diagnosis not present

## 2014-08-01 DIAGNOSIS — R5381 Other malaise: Secondary | ICD-10-CM | POA: Diagnosis not present

## 2014-08-18 DIAGNOSIS — Z23 Encounter for immunization: Secondary | ICD-10-CM | POA: Diagnosis not present

## 2014-09-08 DIAGNOSIS — M0589 Other rheumatoid arthritis with rheumatoid factor of multiple sites: Secondary | ICD-10-CM | POA: Diagnosis not present

## 2014-09-24 DIAGNOSIS — H04123 Dry eye syndrome of bilateral lacrimal glands: Secondary | ICD-10-CM | POA: Diagnosis not present

## 2014-09-25 DIAGNOSIS — E785 Hyperlipidemia, unspecified: Secondary | ICD-10-CM | POA: Diagnosis not present

## 2014-09-25 DIAGNOSIS — G609 Hereditary and idiopathic neuropathy, unspecified: Secondary | ICD-10-CM | POA: Diagnosis not present

## 2014-09-25 DIAGNOSIS — I5032 Chronic diastolic (congestive) heart failure: Secondary | ICD-10-CM | POA: Diagnosis not present

## 2014-09-25 DIAGNOSIS — E039 Hypothyroidism, unspecified: Secondary | ICD-10-CM | POA: Diagnosis not present

## 2014-10-06 DIAGNOSIS — M2550 Pain in unspecified joint: Secondary | ICD-10-CM | POA: Diagnosis not present

## 2014-10-06 DIAGNOSIS — M0579 Rheumatoid arthritis with rheumatoid factor of multiple sites without organ or systems involvement: Secondary | ICD-10-CM | POA: Diagnosis not present

## 2014-10-06 DIAGNOSIS — R300 Dysuria: Secondary | ICD-10-CM | POA: Diagnosis not present

## 2014-10-06 DIAGNOSIS — K900 Celiac disease: Secondary | ICD-10-CM | POA: Diagnosis not present

## 2014-10-14 DIAGNOSIS — N760 Acute vaginitis: Secondary | ICD-10-CM | POA: Diagnosis not present

## 2014-10-20 DIAGNOSIS — M0589 Other rheumatoid arthritis with rheumatoid factor of multiple sites: Secondary | ICD-10-CM | POA: Diagnosis not present

## 2014-12-08 DIAGNOSIS — M0589 Other rheumatoid arthritis with rheumatoid factor of multiple sites: Secondary | ICD-10-CM | POA: Diagnosis not present

## 2014-12-26 DIAGNOSIS — E559 Vitamin D deficiency, unspecified: Secondary | ICD-10-CM | POA: Diagnosis not present

## 2014-12-26 DIAGNOSIS — E039 Hypothyroidism, unspecified: Secondary | ICD-10-CM | POA: Diagnosis not present

## 2014-12-26 DIAGNOSIS — E785 Hyperlipidemia, unspecified: Secondary | ICD-10-CM | POA: Diagnosis not present

## 2015-01-05 DIAGNOSIS — M2550 Pain in unspecified joint: Secondary | ICD-10-CM | POA: Diagnosis not present

## 2015-01-05 DIAGNOSIS — G609 Hereditary and idiopathic neuropathy, unspecified: Secondary | ICD-10-CM | POA: Diagnosis not present

## 2015-01-05 DIAGNOSIS — R102 Pelvic and perineal pain: Secondary | ICD-10-CM | POA: Diagnosis not present

## 2015-01-05 DIAGNOSIS — M0579 Rheumatoid arthritis with rheumatoid factor of multiple sites without organ or systems involvement: Secondary | ICD-10-CM | POA: Diagnosis not present

## 2015-01-06 DIAGNOSIS — E039 Hypothyroidism, unspecified: Secondary | ICD-10-CM | POA: Diagnosis not present

## 2015-01-06 DIAGNOSIS — I5032 Chronic diastolic (congestive) heart failure: Secondary | ICD-10-CM | POA: Diagnosis not present

## 2015-01-06 DIAGNOSIS — E785 Hyperlipidemia, unspecified: Secondary | ICD-10-CM | POA: Diagnosis not present

## 2015-01-06 DIAGNOSIS — J309 Allergic rhinitis, unspecified: Secondary | ICD-10-CM | POA: Diagnosis not present

## 2015-01-07 DIAGNOSIS — N760 Acute vaginitis: Secondary | ICD-10-CM | POA: Diagnosis not present

## 2015-01-13 DIAGNOSIS — M810 Age-related osteoporosis without current pathological fracture: Secondary | ICD-10-CM | POA: Diagnosis not present

## 2015-02-02 DIAGNOSIS — M0579 Rheumatoid arthritis with rheumatoid factor of multiple sites without organ or systems involvement: Secondary | ICD-10-CM | POA: Diagnosis not present

## 2015-03-30 DIAGNOSIS — M0579 Rheumatoid arthritis with rheumatoid factor of multiple sites without organ or systems involvement: Secondary | ICD-10-CM | POA: Diagnosis not present

## 2015-04-07 DIAGNOSIS — Z79899 Other long term (current) drug therapy: Secondary | ICD-10-CM | POA: Diagnosis not present

## 2015-04-07 DIAGNOSIS — M2550 Pain in unspecified joint: Secondary | ICD-10-CM | POA: Diagnosis not present

## 2015-04-07 DIAGNOSIS — M0579 Rheumatoid arthritis with rheumatoid factor of multiple sites without organ or systems involvement: Secondary | ICD-10-CM | POA: Diagnosis not present

## 2015-04-07 DIAGNOSIS — E785 Hyperlipidemia, unspecified: Secondary | ICD-10-CM | POA: Diagnosis not present

## 2015-05-04 ENCOUNTER — Encounter: Payer: Self-pay | Admitting: Gastroenterology

## 2015-05-04 ENCOUNTER — Other Ambulatory Visit: Payer: Self-pay

## 2015-05-25 DIAGNOSIS — M0589 Other rheumatoid arthritis with rheumatoid factor of multiple sites: Secondary | ICD-10-CM | POA: Diagnosis not present

## 2015-07-14 DIAGNOSIS — M2550 Pain in unspecified joint: Secondary | ICD-10-CM | POA: Diagnosis not present

## 2015-07-14 DIAGNOSIS — Z79899 Other long term (current) drug therapy: Secondary | ICD-10-CM | POA: Diagnosis not present

## 2015-07-14 DIAGNOSIS — M0579 Rheumatoid arthritis with rheumatoid factor of multiple sites without organ or systems involvement: Secondary | ICD-10-CM | POA: Diagnosis not present

## 2015-07-20 DIAGNOSIS — M0589 Other rheumatoid arthritis with rheumatoid factor of multiple sites: Secondary | ICD-10-CM | POA: Diagnosis not present

## 2015-07-20 DIAGNOSIS — M0579 Rheumatoid arthritis with rheumatoid factor of multiple sites without organ or systems involvement: Secondary | ICD-10-CM | POA: Diagnosis not present

## 2015-08-13 DIAGNOSIS — M816 Localized osteoporosis [Lequesne]: Secondary | ICD-10-CM | POA: Diagnosis not present

## 2015-08-13 DIAGNOSIS — Z01419 Encounter for gynecological examination (general) (routine) without abnormal findings: Secondary | ICD-10-CM | POA: Diagnosis not present

## 2015-08-13 DIAGNOSIS — N958 Other specified menopausal and perimenopausal disorders: Secondary | ICD-10-CM | POA: Diagnosis not present

## 2015-08-13 DIAGNOSIS — Z6826 Body mass index (BMI) 26.0-26.9, adult: Secondary | ICD-10-CM | POA: Diagnosis not present

## 2015-08-13 DIAGNOSIS — Z1231 Encounter for screening mammogram for malignant neoplasm of breast: Secondary | ICD-10-CM | POA: Diagnosis not present

## 2015-09-21 DIAGNOSIS — M0589 Other rheumatoid arthritis with rheumatoid factor of multiple sites: Secondary | ICD-10-CM | POA: Diagnosis not present

## 2015-09-25 DIAGNOSIS — Z961 Presence of intraocular lens: Secondary | ICD-10-CM | POA: Diagnosis not present

## 2015-09-25 DIAGNOSIS — H04123 Dry eye syndrome of bilateral lacrimal glands: Secondary | ICD-10-CM | POA: Diagnosis not present

## 2015-10-13 DIAGNOSIS — M25561 Pain in right knee: Secondary | ICD-10-CM | POA: Diagnosis not present

## 2015-10-13 DIAGNOSIS — M170 Bilateral primary osteoarthritis of knee: Secondary | ICD-10-CM | POA: Diagnosis not present

## 2015-10-13 DIAGNOSIS — M0579 Rheumatoid arthritis with rheumatoid factor of multiple sites without organ or systems involvement: Secondary | ICD-10-CM | POA: Diagnosis not present

## 2015-10-13 DIAGNOSIS — M069 Rheumatoid arthritis, unspecified: Secondary | ICD-10-CM | POA: Diagnosis not present

## 2015-10-30 DIAGNOSIS — M25561 Pain in right knee: Secondary | ICD-10-CM | POA: Diagnosis not present

## 2015-10-30 DIAGNOSIS — M170 Bilateral primary osteoarthritis of knee: Secondary | ICD-10-CM | POA: Diagnosis not present

## 2015-10-30 DIAGNOSIS — M25562 Pain in left knee: Secondary | ICD-10-CM | POA: Diagnosis not present

## 2015-11-04 DIAGNOSIS — R2689 Other abnormalities of gait and mobility: Secondary | ICD-10-CM | POA: Diagnosis not present

## 2015-11-04 DIAGNOSIS — M1711 Unilateral primary osteoarthritis, right knee: Secondary | ICD-10-CM | POA: Diagnosis not present

## 2015-11-04 DIAGNOSIS — M25561 Pain in right knee: Secondary | ICD-10-CM | POA: Diagnosis not present

## 2015-11-06 DIAGNOSIS — M1712 Unilateral primary osteoarthritis, left knee: Secondary | ICD-10-CM | POA: Diagnosis not present

## 2015-11-06 DIAGNOSIS — M170 Bilateral primary osteoarthritis of knee: Secondary | ICD-10-CM | POA: Diagnosis not present

## 2015-11-06 DIAGNOSIS — M25561 Pain in right knee: Secondary | ICD-10-CM | POA: Diagnosis not present

## 2015-11-06 DIAGNOSIS — R2689 Other abnormalities of gait and mobility: Secondary | ICD-10-CM | POA: Diagnosis not present

## 2015-11-06 DIAGNOSIS — M25562 Pain in left knee: Secondary | ICD-10-CM | POA: Diagnosis not present

## 2015-11-11 DIAGNOSIS — M1711 Unilateral primary osteoarthritis, right knee: Secondary | ICD-10-CM | POA: Diagnosis not present

## 2015-11-11 DIAGNOSIS — R2689 Other abnormalities of gait and mobility: Secondary | ICD-10-CM | POA: Diagnosis not present

## 2015-11-11 DIAGNOSIS — M170 Bilateral primary osteoarthritis of knee: Secondary | ICD-10-CM | POA: Diagnosis not present

## 2015-11-11 DIAGNOSIS — M25561 Pain in right knee: Secondary | ICD-10-CM | POA: Diagnosis not present

## 2015-11-11 DIAGNOSIS — M25562 Pain in left knee: Secondary | ICD-10-CM | POA: Diagnosis not present

## 2015-11-16 DIAGNOSIS — R5383 Other fatigue: Secondary | ICD-10-CM | POA: Diagnosis not present

## 2015-11-16 DIAGNOSIS — M0589 Other rheumatoid arthritis with rheumatoid factor of multiple sites: Secondary | ICD-10-CM | POA: Diagnosis not present

## 2015-11-18 DIAGNOSIS — M25562 Pain in left knee: Secondary | ICD-10-CM | POA: Diagnosis not present

## 2015-11-18 DIAGNOSIS — M1712 Unilateral primary osteoarthritis, left knee: Secondary | ICD-10-CM | POA: Diagnosis not present

## 2015-11-18 DIAGNOSIS — R2689 Other abnormalities of gait and mobility: Secondary | ICD-10-CM | POA: Diagnosis not present

## 2015-11-18 DIAGNOSIS — M25561 Pain in right knee: Secondary | ICD-10-CM | POA: Diagnosis not present

## 2015-11-18 DIAGNOSIS — M170 Bilateral primary osteoarthritis of knee: Secondary | ICD-10-CM | POA: Diagnosis not present

## 2015-11-25 DIAGNOSIS — M25562 Pain in left knee: Secondary | ICD-10-CM | POA: Diagnosis not present

## 2015-11-25 DIAGNOSIS — M25561 Pain in right knee: Secondary | ICD-10-CM | POA: Diagnosis not present

## 2015-11-25 DIAGNOSIS — M1712 Unilateral primary osteoarthritis, left knee: Secondary | ICD-10-CM | POA: Diagnosis not present

## 2015-11-25 DIAGNOSIS — M170 Bilateral primary osteoarthritis of knee: Secondary | ICD-10-CM | POA: Diagnosis not present

## 2015-11-25 DIAGNOSIS — R2689 Other abnormalities of gait and mobility: Secondary | ICD-10-CM | POA: Diagnosis not present

## 2015-11-27 DIAGNOSIS — M25562 Pain in left knee: Secondary | ICD-10-CM | POA: Diagnosis not present

## 2015-11-27 DIAGNOSIS — M1711 Unilateral primary osteoarthritis, right knee: Secondary | ICD-10-CM | POA: Diagnosis not present

## 2015-11-27 DIAGNOSIS — M25561 Pain in right knee: Secondary | ICD-10-CM | POA: Diagnosis not present

## 2015-11-27 DIAGNOSIS — R2689 Other abnormalities of gait and mobility: Secondary | ICD-10-CM | POA: Diagnosis not present

## 2015-11-27 DIAGNOSIS — M170 Bilateral primary osteoarthritis of knee: Secondary | ICD-10-CM | POA: Diagnosis not present

## 2015-12-02 DIAGNOSIS — M25561 Pain in right knee: Secondary | ICD-10-CM | POA: Diagnosis not present

## 2015-12-02 DIAGNOSIS — M170 Bilateral primary osteoarthritis of knee: Secondary | ICD-10-CM | POA: Diagnosis not present

## 2015-12-02 DIAGNOSIS — M25562 Pain in left knee: Secondary | ICD-10-CM | POA: Diagnosis not present

## 2015-12-02 DIAGNOSIS — R2689 Other abnormalities of gait and mobility: Secondary | ICD-10-CM | POA: Diagnosis not present

## 2015-12-02 DIAGNOSIS — M1712 Unilateral primary osteoarthritis, left knee: Secondary | ICD-10-CM | POA: Diagnosis not present

## 2015-12-07 DIAGNOSIS — M170 Bilateral primary osteoarthritis of knee: Secondary | ICD-10-CM | POA: Diagnosis not present

## 2015-12-07 DIAGNOSIS — M25562 Pain in left knee: Secondary | ICD-10-CM | POA: Diagnosis not present

## 2015-12-07 DIAGNOSIS — M1711 Unilateral primary osteoarthritis, right knee: Secondary | ICD-10-CM | POA: Diagnosis not present

## 2015-12-07 DIAGNOSIS — M25561 Pain in right knee: Secondary | ICD-10-CM | POA: Diagnosis not present

## 2015-12-07 DIAGNOSIS — R2689 Other abnormalities of gait and mobility: Secondary | ICD-10-CM | POA: Diagnosis not present

## 2016-01-11 DIAGNOSIS — M0589 Other rheumatoid arthritis with rheumatoid factor of multiple sites: Secondary | ICD-10-CM | POA: Diagnosis not present

## 2016-01-12 DIAGNOSIS — M2550 Pain in unspecified joint: Secondary | ICD-10-CM | POA: Diagnosis not present

## 2016-01-12 DIAGNOSIS — M0579 Rheumatoid arthritis with rheumatoid factor of multiple sites without organ or systems involvement: Secondary | ICD-10-CM | POA: Diagnosis not present

## 2016-01-12 DIAGNOSIS — M8580 Other specified disorders of bone density and structure, unspecified site: Secondary | ICD-10-CM | POA: Diagnosis not present

## 2016-01-12 DIAGNOSIS — Z79899 Other long term (current) drug therapy: Secondary | ICD-10-CM | POA: Diagnosis not present

## 2016-02-26 DIAGNOSIS — R319 Hematuria, unspecified: Secondary | ICD-10-CM | POA: Diagnosis not present

## 2016-02-26 DIAGNOSIS — N390 Urinary tract infection, site not specified: Secondary | ICD-10-CM | POA: Diagnosis not present

## 2016-03-07 DIAGNOSIS — M0579 Rheumatoid arthritis with rheumatoid factor of multiple sites without organ or systems involvement: Secondary | ICD-10-CM | POA: Diagnosis not present

## 2016-05-02 DIAGNOSIS — M0589 Other rheumatoid arthritis with rheumatoid factor of multiple sites: Secondary | ICD-10-CM | POA: Diagnosis not present

## 2016-05-17 DIAGNOSIS — M0579 Rheumatoid arthritis with rheumatoid factor of multiple sites without organ or systems involvement: Secondary | ICD-10-CM | POA: Diagnosis not present

## 2016-05-17 DIAGNOSIS — M8580 Other specified disorders of bone density and structure, unspecified site: Secondary | ICD-10-CM | POA: Diagnosis not present

## 2016-05-17 DIAGNOSIS — Z79899 Other long term (current) drug therapy: Secondary | ICD-10-CM | POA: Diagnosis not present

## 2016-05-17 DIAGNOSIS — M2550 Pain in unspecified joint: Secondary | ICD-10-CM | POA: Diagnosis not present

## 2016-06-29 DIAGNOSIS — M0579 Rheumatoid arthritis with rheumatoid factor of multiple sites without organ or systems involvement: Secondary | ICD-10-CM | POA: Diagnosis not present

## 2016-06-29 DIAGNOSIS — M0589 Other rheumatoid arthritis with rheumatoid factor of multiple sites: Secondary | ICD-10-CM | POA: Diagnosis not present

## 2016-07-04 ENCOUNTER — Other Ambulatory Visit: Payer: Self-pay

## 2016-07-13 ENCOUNTER — Telehealth: Payer: Self-pay | Admitting: Cardiovascular Disease

## 2016-07-13 DIAGNOSIS — Z79899 Other long term (current) drug therapy: Secondary | ICD-10-CM | POA: Diagnosis not present

## 2016-07-13 DIAGNOSIS — M0579 Rheumatoid arthritis with rheumatoid factor of multiple sites without organ or systems involvement: Secondary | ICD-10-CM | POA: Diagnosis not present

## 2016-07-13 DIAGNOSIS — M8580 Other specified disorders of bone density and structure, unspecified site: Secondary | ICD-10-CM | POA: Diagnosis not present

## 2016-07-13 DIAGNOSIS — M2550 Pain in unspecified joint: Secondary | ICD-10-CM | POA: Diagnosis not present

## 2016-07-13 DIAGNOSIS — M19042 Primary osteoarthritis, left hand: Secondary | ICD-10-CM | POA: Diagnosis not present

## 2016-07-13 NOTE — Telephone Encounter (Signed)
Received records from [facility] for appointment on 07/20/16 with Dr Allyson Sabal.  Records given to [facility] (medical records) for Dr Hazle Coca schedule on 07/20/16. lp

## 2016-07-15 DIAGNOSIS — M05762 Rheumatoid arthritis with rheumatoid factor of left knee without organ or systems involvement: Secondary | ICD-10-CM | POA: Diagnosis not present

## 2016-07-15 DIAGNOSIS — M05761 Rheumatoid arthritis with rheumatoid factor of right knee without organ or systems involvement: Secondary | ICD-10-CM | POA: Diagnosis not present

## 2016-07-20 ENCOUNTER — Ambulatory Visit (INDEPENDENT_AMBULATORY_CARE_PROVIDER_SITE_OTHER): Payer: Medicare Other | Admitting: Cardiovascular Disease

## 2016-07-20 ENCOUNTER — Encounter: Payer: Self-pay | Admitting: Cardiovascular Disease

## 2016-07-20 DIAGNOSIS — I712 Thoracic aortic aneurysm, without rupture, unspecified: Secondary | ICD-10-CM

## 2016-07-20 LAB — BASIC METABOLIC PANEL
BUN: 21 mg/dL (ref 7–25)
CHLORIDE: 105 mmol/L (ref 98–110)
CO2: 25 mmol/L (ref 20–31)
CREATININE: 0.84 mg/dL (ref 0.60–0.93)
Calcium: 9.9 mg/dL (ref 8.6–10.4)
GLUCOSE: 91 mg/dL (ref 65–99)
POTASSIUM: 4.4 mmol/L (ref 3.5–5.3)
Sodium: 140 mmol/L (ref 135–146)

## 2016-07-20 NOTE — Progress Notes (Signed)
     07/20/2016 Franchot Gallo   04-18-1941  [id number]  Primary Physician Thayer Headings, MD Primary Cardiologist: Runell Gess MD Roseanne Reno  HPI:  Ms. Denne is a 75 year old thin-appearing married Caucasian female mother of one child, grandmother and 2 grandchildren referred by Dr.Syed at [facility] per medical for evaluation of an abnormal chest x-ray concerning for thoracic aortic aneurysm. She has a history of rheumatoid arthritis and temporal arteritis in the past. She really has no chronic risk factors other than a father who apparently died of a myocardial infarction. She denies chest pain or shortness of breath.   Current Outpatient Prescriptions  Medication Sig Dispense Refill  . inFLIXimab (REMICADE) 100 MG injection Inject 5 mg/kg into the vein every 6 (six) weeks.    . Probiotic Product (FLORA-Q PO) Three tabs By mouth daily     . thyroid (ARMOUR) 15 MG tablet Take 15 mg by mouth daily.    Marland Kitchen VITAMIN D, ERGOCALCIFEROL, PO Take 5,000 Units by mouth daily. Take one by mouth twice daily      No current facility-administered medications for this visit.     Allergies  Allergen Reactions  . Codeine Itching  . Erythromycin Itching and Rash  . Ketorolac Other (See Comments)    Severe headaches   . Morphine Itching  . Naproxen Sodium Other (See Comments)    Severe headaches  . Penicillins Itching and Rash  . Sulfonamide Derivatives Itching and Rash    Social History   Social History  . Marital status: Married    Spouse name: N/A  . Number of children: 1  . Years of education: N/A   Occupational History  . 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 Smoking status: Never Smoker  . Smokeless tobacco: Never Used  . Alcohol use No  . Drug use: No  . Sexual activity: Not on file   Other Topics Concern  . Not on file   Social History Narrative  . No narrative on file     Review of Systems: General: negative for chills,  fever, night sweats or weight changes.  Cardiovascular: negative for chest pain, dyspnea on exertion, edema, orthopnea, palpitations, paroxysmal nocturnal dyspnea or shortness of breath Dermatological: negative for rash Respiratory: negative for cough or wheezing Urologic: negative for hematuria Abdominal: negative for nausea, vomiting, diarrhea, bright red blood per rectum, melena, or hematemesis Neurologic: negative for visual changes, syncope, or dizziness All other systems reviewed and are otherwise negative except as noted above.    Blood pressure 130/86, pulse (!) 56, height 5\' 7"  (1.702 m), weight 160 lb 12.8 oz (72.9 kg).  General appearance: alert and no distress Neck: no adenopathy, no carotid bruit, no JVD, supple, symmetrical, trachea midline and thyroid not enlarged, symmetric, no tenderness/mass/nodules Lungs: clear to auscultation bilaterally Heart: regular rate and rhythm, S1, S2 normal, no murmur, click, rub or gallop Extremities: extremities normal, atraumatic, no cyanosis or edema  EKG sinus bradycardia of 56 without ST or T-wave changes. I personally reviewed this EKG  ASSESSMENT AND PLAN:   Aneurysm of thoracic aorta (HCC) Miss Sow was referred by Dr. Ivin Booty for evaluation of an abnormal chest x-ray. There is a question of thoracic aortic aneurysm. I'm going to order a CT angiography and upper chest to further evaluate this.      Kathi Ludwig MD FACP,FACC,FAHA, [facility] 07/20/2016 10:33 AM

## 2016-07-20 NOTE — Assessment & Plan Note (Signed)
Stephanie Ray was referred by Dr. Kathi Ludwig for evaluation of an abnormal chest x-ray. There is a question of thoracic aortic aneurysm. I'm going to order a CT angiography and upper chest to further evaluate this.

## 2016-07-20 NOTE — Patient Instructions (Addendum)
Medication Instructions:  Your physician recommends that you continue on your current medications as directed. Please refer to the Current Medication list given to you today.  Labwork: BMET AT [facility] ON THE FIRST FLOOR  Testing/Procedures: Non-Cardiac CT Angiography (CTA), is a special type of CT scan that uses a computer to produce multi-dimensional views of major blood vessels throughout the body. In CT angiography, a contrast material is injected through an IV to help visualize the blood vessels  Follow-Up: As needed  If you need a refill on your cardiac medications before your next appointment, please call your pharmacy.

## 2016-07-21 ENCOUNTER — Telehealth: Payer: Self-pay | Admitting: *Deleted

## 2016-07-21 NOTE — Telephone Encounter (Signed)
LEFT MESSAGE TO CALL BACK - IN REGARDS TO LABS  RELEASE TO MY CHART

## 2016-07-21 NOTE — Telephone Encounter (Signed)
-----   Message from Runell Gess, MD sent at 07/21/2016 11:00 AM EDT ----- Call and tell normal

## 2016-07-22 NOTE — Telephone Encounter (Signed)
Pt advised on normal results, also to proceed w CT scan as scheduled. She voiced thanks.

## 2016-07-22 NOTE — Telephone Encounter (Signed)
F/u Message ° °Pt returning RN call. Please call back to discuss  °

## 2016-07-29 ENCOUNTER — Ambulatory Visit (INDEPENDENT_AMBULATORY_CARE_PROVIDER_SITE_OTHER)
Admission: RE | Admit: 2016-07-29 | Discharge: 2016-07-29 | Disposition: A | Discharge status: Home / Self Care | Payer: Medicare Other | Source: Ambulatory Visit | Attending: Cardiovascular Disease | Admitting: Cardiovascular Disease

## 2016-07-29 ENCOUNTER — Encounter: Payer: Self-pay | Admitting: Radiology

## 2016-07-29 DIAGNOSIS — I712 Thoracic aortic aneurysm, without rupture, unspecified: Secondary | ICD-10-CM

## 2016-07-29 DIAGNOSIS — I714 Abdominal aortic aneurysm, without rupture: Secondary | ICD-10-CM | POA: Diagnosis not present

## 2016-07-29 MED ORDER — IOPAMIDOL (ISOVUE-370) INJECTION 76%
100.0000 mL | Freq: Once | INTRAVENOUS | Status: AC | PRN
  Administered 2016-07-29: 100 mL via INTRAVENOUS

## 2016-08-05 ENCOUNTER — Telehealth: Payer: Self-pay | Admitting: Cardiovascular Disease

## 2016-08-05 NOTE — Telephone Encounter (Signed)
Spoke to patient . Result given  verbalized understanding Patient states she unable view information on [facility]

## 2016-08-05 NOTE — Telephone Encounter (Signed)
Pt calling to get CT report from 07-29-16.

## 2016-08-08 DIAGNOSIS — Z23 Encounter for immunization: Secondary | ICD-10-CM | POA: Diagnosis not present

## 2016-08-17 DIAGNOSIS — M8580 Other specified disorders of bone density and structure, unspecified site: Secondary | ICD-10-CM | POA: Diagnosis not present

## 2016-08-17 DIAGNOSIS — R21 Rash and other nonspecific skin eruption: Secondary | ICD-10-CM | POA: Diagnosis not present

## 2016-08-17 DIAGNOSIS — M0579 Rheumatoid arthritis with rheumatoid factor of multiple sites without organ or systems involvement: Secondary | ICD-10-CM | POA: Diagnosis not present

## 2016-08-17 DIAGNOSIS — Z79899 Other long term (current) drug therapy: Secondary | ICD-10-CM | POA: Diagnosis not present

## 2016-08-23 ENCOUNTER — Encounter: Payer: Self-pay | Admitting: Physician Assistant

## 2016-08-23 DIAGNOSIS — M2550 Pain in unspecified joint: Secondary | ICD-10-CM | POA: Diagnosis not present

## 2016-08-23 DIAGNOSIS — M791 Myalgia: Secondary | ICD-10-CM | POA: Diagnosis not present

## 2016-08-23 DIAGNOSIS — R05 Cough: Secondary | ICD-10-CM | POA: Diagnosis not present

## 2016-08-23 DIAGNOSIS — R5383 Other fatigue: Secondary | ICD-10-CM | POA: Diagnosis not present

## 2016-08-23 DIAGNOSIS — E538 Deficiency of other specified B group vitamins: Secondary | ICD-10-CM | POA: Diagnosis not present

## 2016-08-23 DIAGNOSIS — R413 Other amnesia: Secondary | ICD-10-CM | POA: Diagnosis not present

## 2016-08-23 DIAGNOSIS — R21 Rash and other nonspecific skin eruption: Secondary | ICD-10-CM | POA: Diagnosis not present

## 2016-08-23 DIAGNOSIS — M0609 Rheumatoid arthritis without rheumatoid factor, multiple sites: Secondary | ICD-10-CM | POA: Diagnosis not present

## 2016-08-24 DIAGNOSIS — G3184 Mild cognitive impairment, so stated: Secondary | ICD-10-CM | POA: Diagnosis not present

## 2016-09-01 DIAGNOSIS — Z6826 Body mass index (BMI) 26.0-26.9, adult: Secondary | ICD-10-CM | POA: Diagnosis not present

## 2016-09-01 DIAGNOSIS — Z01419 Encounter for gynecological examination (general) (routine) without abnormal findings: Secondary | ICD-10-CM | POA: Diagnosis not present

## 2016-09-06 ENCOUNTER — Other Ambulatory Visit (INDEPENDENT_AMBULATORY_CARE_PROVIDER_SITE_OTHER): Payer: Medicare Other

## 2016-09-06 ENCOUNTER — Ambulatory Visit (INDEPENDENT_AMBULATORY_CARE_PROVIDER_SITE_OTHER): Payer: Medicare Other | Admitting: Physician Assistant

## 2016-09-06 ENCOUNTER — Encounter: Payer: Self-pay | Admitting: Physician Assistant

## 2016-09-06 VITALS — BP 112/74 | HR 72 | Ht 69.0 in | Wt 163.0 lb

## 2016-09-06 DIAGNOSIS — Z933 Colostomy status: Secondary | ICD-10-CM | POA: Diagnosis not present

## 2016-09-06 DIAGNOSIS — R1032 Left lower quadrant pain: Secondary | ICD-10-CM | POA: Diagnosis not present

## 2016-09-06 DIAGNOSIS — K631 Perforation of intestine (nontraumatic): Secondary | ICD-10-CM | POA: Diagnosis not present

## 2016-09-06 LAB — CBC WITH DIFFERENTIAL/PLATELET
BASOS ABS: 0 10*3/uL (ref 0.0–0.1)
BASOS PCT: 0.5 % (ref 0.0–3.0)
EOS ABS: 0.1 10*3/uL (ref 0.0–0.7)
Eosinophils Relative: 1.8 % (ref 0.0–5.0)
HCT: 37.5 % (ref 36.0–46.0)
Hemoglobin: 12.7 g/dL (ref 12.0–15.0)
LYMPHS ABS: 1.5 10*3/uL (ref 0.7–4.0)
Lymphocytes Relative: 25.5 % (ref 12.0–46.0)
MCHC: 34 g/dL (ref 30.0–36.0)
MCV: 93.9 fl (ref 78.0–100.0)
MONOS PCT: 6 % (ref 3.0–12.0)
Monocytes Absolute: 0.3 10*3/uL (ref 0.1–1.0)
NEUTROS ABS: 3.8 10*3/uL (ref 1.4–7.7)
NEUTROS PCT: 66.2 % (ref 43.0–77.0)
PLATELETS: 247 10*3/uL (ref 150.0–400.0)
RBC: 3.99 Mil/uL (ref 3.87–5.11)
RDW: 13.9 % (ref 11.5–15.5)
WBC: 5.7 10*3/uL (ref 4.0–10.5)

## 2016-09-06 LAB — BASIC METABOLIC PANEL
BUN: 15 mg/dL (ref 6–23)
CHLORIDE: 106 meq/L (ref 96–112)
CO2: 27 mEq/L (ref 19–32)
CREATININE: 0.73 mg/dL (ref 0.40–1.20)
Calcium: 9.6 mg/dL (ref 8.4–10.5)
GFR: 82.36 mL/min (ref >60.00)
Glucose, Bld: 94 mg/dL (ref 70–99)
Potassium: 4.1 mEq/L (ref 3.5–5.1)
Sodium: 142 mEq/L (ref 135–145)

## 2016-09-06 NOTE — Progress Notes (Signed)
Subjective:    Patient ID: Stephanie Ray, female    DOB: 14-Oct-1941, 75 y.o.   MRN: [id number]  HPI Stephanie Ray is a 75 year old white female referred today on the suggestion of her rheumatologist, Dr. Zenovia Jordan for evaluation of left-sided abdominal pain.. Patient has history of rheumatoid arthritis and giant cell arteritis. She had been seen here last in 2010 by Dr. Arlyce Dice. She had undergone workup at that time for iron deficiency anemia. She had EGD showing a 4-5 cm hiatal hernia and an early distal stricture which was not dilated. Capsule endoscopy was done which did show mild gastritis and a few erosions in the mid small bowel. Patient has history of perforated sigmoid colon in 2009. Colonoscopy was done by Dr. Charna Elizabeth. I do not have a copy of that report at this time but apparently had a polyp removed from the sigmoid colon which later perforated requiring hospitalization and emergency surgery 4 days later . She had a sigmoid colectomy and colostomy which was reversed about 4 months later. Patient has not had follow-up colonoscopy since that time. She says that she has had some intermittent discomfort in her lower abdomen ever since her surgery but this has been much more prominent over the past several months. Her husband says that she complains of pain with walking at times and riding in the car ,going over bumps etc. She has not noticed any changes with oral intake. Appetite has been fine and weight has been stable. Bowel movements have been normal for her, no melena or hematochezia. She does not want to have another colonoscopy, and states that she "almost died" as a result of her previous perforation.  Review of Systems Pertinent positive and negative review of systems were noted in the above HPI section.  All other review of systems was otherwise negative.  Outpatient Encounter Prescriptions as of 09/06/2016  Medication Sig  . b complex vitamins tablet Take 1 tablet by mouth  daily.  . Loratadine (CLARITIN) 10 MG CAPS Take by mouth.  . Probiotic Product (FLORA-Q PO) Three tabs By mouth daily   . ranitidine (ZANTAC) 150 MG capsule Take 150 mg by mouth 2 (two) times daily.  Marland Kitchen thyroid (ARMOUR) 15 MG tablet Take 15 mg by mouth daily.  Marland Kitchen VITAMIN D, ERGOCALCIFEROL, PO Take 5,000 Units by mouth daily. Take one by mouth twice daily   . inFLIXimab (REMICADE) 100 MG injection Inject 5 mg/kg into the vein every 6 (six) weeks.   No facility-administered encounter medications on file as of 09/06/2016.    Allergies  Allergen Reactions  . Codeine Itching  . Erythromycin Itching and Rash  . Ketorolac Other (See Comments)    Severe headaches   . Morphine Itching  . Naproxen Sodium Other (See Comments)    Severe headaches  . Penicillins Itching and Rash  . Sulfonamide Derivatives Itching and Rash   Patient Active Problem List   Diagnosis Date Noted  . PAIN IN JOINT, MULTIPLE SITES 08/26/2009  . ANEMIA, IRON DEFICIENCY 07/30/2009  . PERSONAL HISTORY OF COLONIC POLYPS 07/30/2009  . Aneurysm of thoracic aorta (HCC) 03/04/2008  . ALLERGIC RHINITIS 03/04/2008  . PULMONARY NODULE, RIGHT LOWER LOBE 03/04/2008  . SHORTNESS OF BREATH (SOB) 03/04/2008  . COUGH 03/04/2008   Social History   Social History  . Marital status: Married    Spouse name: N/A  . Number of children: 1  . Years of education: N/A   Occupational History  . 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 Smoking status: Never Smoker  . Smokeless tobacco: Never Used  . Alcohol use No  . Drug use: No  . Sexual activity: Not on file   Other Topics Concern  . Not on file   Social History Narrative  . No narrative on file    Ms. Stephanie Ray's family history includes Cancer in her mother; Colon polyps in her brother; Congenital heart disease in her mother; Heart disease in her father.      Objective:    Vitals:   09/06/16 0943  BP: 112/74  Pulse: 72    Physical Exam     well-developed older white female in no acute distress, accompanied by her husband blood pressure 112/74 pulse 72, BMI 24.07. HEENT;nontraumatic normocephalic EOMI PERRLA sclera anicteric, Cardiovascular; regular rate and rhythm with S1-S2 no murmur or gallop, Pulmonary ;clear bilaterally, Abdomen; soft, she is midline incisional scar and a scar from previous colostomy in the left mid abdomen, she is tender in the left lower quadrant there is no guarding or rebound no palpable mass or hepatosplenomegaly, bowel sounds are present no bruit, Rectal; exam not done, Extremities ;no clubbing cyanosis or edema she does have rheumatoid changes bilaterally of her hands, Neuropsych ;mood and affect appropriate       Assessment & Plan:   #40 75 year old white female with left lower abdominal pain intermittent times several months, aggravated by walking and riding in the car. Etiology is not clear rule out low-grade intra-abdominal inflammatory process, smoldering diverticulitis, occult lesion, possible abdominal wall hernia though not palpable and pain secondary to adhesions. #2 status post sigmoid colon perforation 2009 post polypectomy. Patient required emergency sigmoid colectomy and temporary colostomy #3  rheumatoid arthritis-on Remicade #4  history of giant cell arteritis  #5 history of iron deficiency anemia previous GI evaluation 2010 negative  Plan; check CBC and BMET, and IFOB Schedule for CT of the abdomen and pelvis with contrast. We will obtain copy of her previous colonoscopy from 2009/Dr. Loreta Ave. Further plans pending results of CT scan. Patient very unlikely to consent to follow-up colonoscopy. Patient will be established with Dr. Myrtie Neither.   Amy Oswald Hillock PA-C 09/06/2016   Cc: Thayer Headings, MD

## 2016-09-06 NOTE — Patient Instructions (Addendum)
Please go to the basement level to have your labs drawn and IFOB stool test.    You have been scheduled for a CT scan of the abdomen and pelvis at [facility] ([address]---this is in the same building as Press [facility]).   You are scheduled on MOnday 11-6 at 4:00 PM. You should arrive at 3:45 PM 15 to your appointment time for registration. Please follow the written instructions below on the day of your exam:  WARNING: IF YOU ARE ALLERGIC TO IODINE/X-RAY DYE, PLEASE NOTIFY RADIOLOGY IMMEDIATELY AT [phone]! YOU WILL BE GIVEN A 13 HOUR PREMEDICATION PREP.  1) Do not eat or drink anything after 12:00 Noon (4 hours prior to your test) 2) You have been given 2 bottles of oral contrast to drink. The solution may taste               better if refrigerated, but do NOT add ice or any other liquid to this solution. Shake             well before drinking.    Drink 1 bottle of contrast @ 2:00 Pm (2 hours prior to your exam)  Drink 1 bottle of contrast @ 3:00 PM (1 hour prior to your exam)  You may take any medications as prescribed with a small amount of water except for the following: Metformin, Glucophage, Glucovance, Avandamet, Riomet, Fortamet, Actoplus Met, Janumet, Glumetza or Metaglip. The above medications must be held the day of the exam AND 48 hours after the exam.  The purpose of you drinking the oral contrast is to aid in the visualization of your intestinal tract. The contrast solution may cause some diarrhea. Before your exam is started, you will be given a small amount of fluid to drink. Depending on your individual set of symptoms, you may also receive an intravenous injection of x-ray contrast/dye. Plan on being at [facility] for 30 minutes or long, depending on the type of exam you are having performed.  If you have any questions regarding your exam or if you need to reschedule, you may call the CT department at [phone] between the hours of 8:00 am  and 5:00 pm, Monday-Friday.  ________________________________________________________________________

## 2016-09-06 NOTE — Progress Notes (Signed)
Thank you for sending this case to me. I have reviewed the entire note, and the outlined plan seems appropriate.   CT to rule out serious intra-abdominal pathology. Sounds like probable post-operative adhesions.  Given her history, would not pursue another colonoscopy if CT normal

## 2016-09-12 ENCOUNTER — Ambulatory Visit (INDEPENDENT_AMBULATORY_CARE_PROVIDER_SITE_OTHER)
Admission: RE | Admit: 2016-09-12 | Discharge: 2016-09-12 | Disposition: A | Discharge status: Home / Self Care | Payer: Medicare Other | Source: Ambulatory Visit | Attending: Physician Assistant | Admitting: Physician Assistant

## 2016-09-12 DIAGNOSIS — R1032 Left lower quadrant pain: Secondary | ICD-10-CM | POA: Diagnosis not present

## 2016-09-12 DIAGNOSIS — Z933 Colostomy status: Secondary | ICD-10-CM

## 2016-09-12 DIAGNOSIS — K631 Perforation of intestine (nontraumatic): Secondary | ICD-10-CM | POA: Diagnosis not present

## 2016-09-12 MED ORDER — IOPAMIDOL (ISOVUE-300) INJECTION 61%
100.0000 mL | Freq: Once | INTRAVENOUS | Status: AC | PRN
  Administered 2016-09-12: 100 mL via INTRAVENOUS

## 2016-09-13 ENCOUNTER — Other Ambulatory Visit (INDEPENDENT_AMBULATORY_CARE_PROVIDER_SITE_OTHER): Payer: Medicare Other

## 2016-09-13 DIAGNOSIS — Z933 Colostomy status: Secondary | ICD-10-CM

## 2016-09-13 DIAGNOSIS — K631 Perforation of intestine (nontraumatic): Secondary | ICD-10-CM | POA: Diagnosis not present

## 2016-09-13 DIAGNOSIS — R1032 Left lower quadrant pain: Secondary | ICD-10-CM

## 2016-09-13 LAB — FECAL OCCULT BLOOD, IMMUNOCHEMICAL: FECAL OCCULT BLD: NEGATIVE

## 2016-09-20 DIAGNOSIS — R21 Rash and other nonspecific skin eruption: Secondary | ICD-10-CM | POA: Diagnosis not present

## 2016-09-20 DIAGNOSIS — E538 Deficiency of other specified B group vitamins: Secondary | ICD-10-CM | POA: Diagnosis not present

## 2016-09-20 DIAGNOSIS — R05 Cough: Secondary | ICD-10-CM | POA: Diagnosis not present

## 2016-09-20 DIAGNOSIS — M2550 Pain in unspecified joint: Secondary | ICD-10-CM | POA: Diagnosis not present

## 2016-09-20 DIAGNOSIS — M0609 Rheumatoid arthritis without rheumatoid factor, multiple sites: Secondary | ICD-10-CM | POA: Diagnosis not present

## 2016-09-23 DIAGNOSIS — H26493 Other secondary cataract, bilateral: Secondary | ICD-10-CM | POA: Diagnosis not present

## 2016-09-23 DIAGNOSIS — Z961 Presence of intraocular lens: Secondary | ICD-10-CM | POA: Diagnosis not present

## 2016-10-26 DIAGNOSIS — Z1231 Encounter for screening mammogram for malignant neoplasm of breast: Secondary | ICD-10-CM | POA: Diagnosis not present

## 2016-12-09 DIAGNOSIS — G629 Polyneuropathy, unspecified: Secondary | ICD-10-CM | POA: Diagnosis not present

## 2016-12-09 DIAGNOSIS — M791 Myalgia: Secondary | ICD-10-CM | POA: Diagnosis not present

## 2016-12-09 DIAGNOSIS — M0609 Rheumatoid arthritis without rheumatoid factor, multiple sites: Secondary | ICD-10-CM | POA: Diagnosis not present

## 2016-12-09 DIAGNOSIS — E663 Overweight: Secondary | ICD-10-CM | POA: Diagnosis not present

## 2016-12-09 DIAGNOSIS — E538 Deficiency of other specified B group vitamins: Secondary | ICD-10-CM | POA: Diagnosis not present

## 2016-12-09 DIAGNOSIS — Z6826 Body mass index (BMI) 26.0-26.9, adult: Secondary | ICD-10-CM | POA: Diagnosis not present

## 2016-12-09 DIAGNOSIS — M2550 Pain in unspecified joint: Secondary | ICD-10-CM | POA: Diagnosis not present

## 2017-01-18 DIAGNOSIS — I5032 Chronic diastolic (congestive) heart failure: Secondary | ICD-10-CM | POA: Diagnosis not present

## 2017-01-18 DIAGNOSIS — E559 Vitamin D deficiency, unspecified: Secondary | ICD-10-CM | POA: Diagnosis not present

## 2017-01-18 DIAGNOSIS — Z0000 Encounter for general adult medical examination without abnormal findings: Secondary | ICD-10-CM | POA: Diagnosis not present

## 2017-01-18 DIAGNOSIS — E785 Hyperlipidemia, unspecified: Secondary | ICD-10-CM | POA: Diagnosis not present

## 2017-01-18 DIAGNOSIS — E039 Hypothyroidism, unspecified: Secondary | ICD-10-CM | POA: Diagnosis not present

## 2017-01-25 DIAGNOSIS — E039 Hypothyroidism, unspecified: Secondary | ICD-10-CM | POA: Diagnosis not present

## 2017-01-25 DIAGNOSIS — E785 Hyperlipidemia, unspecified: Secondary | ICD-10-CM | POA: Diagnosis not present

## 2017-01-25 DIAGNOSIS — N182 Chronic kidney disease, stage 2 (mild): Secondary | ICD-10-CM | POA: Diagnosis not present

## 2017-01-25 DIAGNOSIS — I5032 Chronic diastolic (congestive) heart failure: Secondary | ICD-10-CM | POA: Diagnosis not present

## 2017-02-07 ENCOUNTER — Emergency Department (HOSPITAL_COMMUNITY)
Admission: EM | Admit: 2017-02-07 | Discharge: 2017-02-07 | Disposition: A | Discharge status: Home / Self Care | Payer: Medicare Other | Attending: Emergency Medicine | Admitting: Emergency Medicine

## 2017-02-07 ENCOUNTER — Encounter (HOSPITAL_COMMUNITY): Payer: Self-pay | Admitting: Emergency Medicine

## 2017-02-07 ENCOUNTER — Emergency Department (HOSPITAL_COMMUNITY): Payer: Medicare Other

## 2017-02-07 DIAGNOSIS — Z79899 Other long term (current) drug therapy: Secondary | ICD-10-CM | POA: Insufficient documentation

## 2017-02-07 DIAGNOSIS — N202 Calculus of kidney with calculus of ureter: Secondary | ICD-10-CM | POA: Diagnosis not present

## 2017-02-07 DIAGNOSIS — I509 Heart failure, unspecified: Secondary | ICD-10-CM | POA: Diagnosis not present

## 2017-02-07 DIAGNOSIS — R1032 Left lower quadrant pain: Secondary | ICD-10-CM | POA: Diagnosis present

## 2017-02-07 DIAGNOSIS — N200 Calculus of kidney: Secondary | ICD-10-CM | POA: Diagnosis not present

## 2017-02-07 LAB — CBC
HEMATOCRIT: 36.9 % (ref 36.0–46.0)
HEMOGLOBIN: 12.5 g/dL (ref 12.0–15.0)
MCH: 30.4 pg (ref 26.0–34.0)
MCHC: 33.9 g/dL (ref 30.0–36.0)
MCV: 89.8 fL (ref 78.0–100.0)
Platelets: 251 10*3/uL (ref 150–400)
RBC: 4.11 MIL/uL (ref 3.87–5.11)
RDW: 13.1 % (ref 11.5–15.5)
WBC: 7.5 10*3/uL (ref 4.0–10.5)

## 2017-02-07 LAB — COMPREHENSIVE METABOLIC PANEL
ALK PHOS: 77 U/L (ref 38–126)
ALT: 12 U/L — ABNORMAL LOW (ref 14–54)
ANION GAP: 10 (ref 5–15)
AST: 19 U/L (ref 15–41)
Albumin: 4.2 g/dL (ref 3.5–5.0)
BUN: 16 mg/dL (ref 6–20)
CO2: 26 mmol/L (ref 22–32)
Calcium: 9.2 mg/dL (ref 8.9–10.3)
Chloride: 104 mmol/L (ref 101–111)
Creatinine, Ser: 0.85 mg/dL (ref 0.44–1.00)
GFR calc Af Amer: >60 mL/min (ref >60)
GFR calc non Af Amer: >60 mL/min (ref >60)
GLUCOSE: 105 mg/dL — AB (ref 65–99)
POTASSIUM: 3.8 mmol/L (ref 3.5–5.1)
SODIUM: 140 mmol/L (ref 135–145)
TOTAL PROTEIN: 7.2 g/dL (ref 6.5–8.1)
Total Bilirubin: 0.8 mg/dL (ref 0.3–1.2)

## 2017-02-07 LAB — URINALYSIS, ROUTINE W REFLEX MICROSCOPIC
BACTERIA UA: NONE SEEN
BILIRUBIN URINE: NEGATIVE
GLUCOSE, UA: NEGATIVE mg/dL
Ketones, ur: 20 mg/dL — AB
NITRITE: NEGATIVE
PROTEIN: 30 mg/dL — AB
SPECIFIC GRAVITY, URINE: 1.017 (ref 1.005–1.030)
pH: 6 (ref 5.0–8.0)

## 2017-02-07 LAB — LIPASE, BLOOD: Lipase: 26 U/L (ref 11–51)

## 2017-02-07 MED ORDER — IOPAMIDOL (ISOVUE-300) INJECTION 61%
INTRAVENOUS | Status: AC
  Administered 2017-02-07: 100 mL
  Filled 2017-02-07: qty 100

## 2017-02-07 MED ORDER — OXYCODONE-ACETAMINOPHEN 5-325 MG PO TABS: 1.0000 | ORAL_TABLET | ORAL | 0 refills | Status: DC | PRN

## 2017-02-07 MED ORDER — HYDROMORPHONE HCL 1 MG/ML IJ SOLN
0.5000 mg | Freq: Once | INTRAMUSCULAR | Status: AC
  Administered 2017-02-07: 0.5 mg via INTRAVENOUS
  Filled 2017-02-07: qty 1

## 2017-02-07 MED ORDER — ONDANSETRON HCL 4 MG/2ML IJ SOLN
4.0000 mg | Freq: Once | INTRAMUSCULAR | Status: AC
  Administered 2017-02-07: 4 mg via INTRAVENOUS
  Filled 2017-02-07: qty 2

## 2017-02-07 MED ORDER — ONDANSETRON 4 MG PO TBDP: 4.0000 mg | ORAL_TABLET | Freq: Three times a day (TID) | ORAL | 0 refills | Status: DC | PRN

## 2017-02-07 NOTE — ED Notes (Signed)
Waiting on CT scan

## 2017-02-07 NOTE — Discharge Instructions (Signed)
Call Dr. Berneice Heinrich tomorrow to schedule an appointment for as soon as possible. Return to the emergency department if you develop a fever, have uncontrolled pain or vomiting, or for new concern.

## 2017-02-07 NOTE — ED Notes (Signed)
Patient transported to CT 

## 2017-02-07 NOTE — ED Notes (Signed)
Pt ambulatory and independent at discharge.  Verbalized understanding of discharge instructions 

## 2017-02-07 NOTE — ED Triage Notes (Signed)
Pt reports LLQ pain since yesterday. Also had emesis yesterday. LBM yesterday, no diarrhea.

## 2017-02-07 NOTE — ED Notes (Signed)
Pt ambulatory to restroom with little assistance 

## 2017-02-07 NOTE — ED Provider Notes (Signed)
[facility]-EMERGENCY DEPT Provider Note   CSN: [id number] Arrival date & time: 02/07/17  1612     History   Chief Complaint Chief Complaint  Patient presents with  . Abdominal Pain    HPI Stephanie Ray is a 76 y.o. female with a past medical history of RA, history of multiple abdominal surgeries including colectomy with revision, and previous perforation after colonoscopy. The patient presents emergency Department with chief complaint of left lower quadrant abdominal pain. Patient onset of symptoms was 2 days ago. She has associated anorexia, nausea. She had one episode of stool yesterday which was normal and one episode of vomiting yesterday. She rates her pain at 8-9 out of 10. She denies dysuria, hematuria, urgency or frequency. She denies flank pain, chest pain, shortness of breath. Patient denies fevers or chills. She denies a history of small bowel obstruction. She does have a history of diverticula.  HPI  Past Medical History:  Diagnosis Date  . ALLERGIC RHINITIS   . Anemia   . Anxiety   . Arthritis   . Carpal tunnel syndrome    right  . Chest pain   . CHF (congestive heart failure) (HCC)   . Colon polyps    adenomatous  . Cough    chronic  . Depression   . Diverticulitis   . Hyperlipidemia   . RA (rheumatoid arthritis) (HCC)   . Rupture of transverse colon (HCC)   . Thyroid disease   . UTI (lower urinary tract infection)     Patient Active Problem List   Diagnosis Date Noted  . PAIN IN JOINT, MULTIPLE SITES 08/26/2009  . ANEMIA, IRON DEFICIENCY 07/30/2009  . PERSONAL HISTORY OF COLONIC POLYPS 07/30/2009  . Aneurysm of thoracic aorta (HCC) 03/04/2008  . ALLERGIC RHINITIS 03/04/2008  . PULMONARY NODULE, RIGHT LOWER LOBE 03/04/2008  . SHORTNESS OF BREATH (SOB) 03/04/2008  . COUGH 03/04/2008    Past Surgical History:  Procedure Laterality Date  . BACK SURGERY  1984  . BREAST CYST EXCISION    . COLOSTOMY    . COLOSTOMY REVERSAL    . herniated disc    .  MENISCUS REPAIR     left knee torn  . PARATHYROIDECTOMY      OB History    No data available       Home Medications    Prior to Admission medications   Medication Sig Start Date End Date Taking? Authorizing Provider  b complex vitamins tablet Take 1 tablet by mouth daily.    Historical Provider, MD  inFLIXimab (REMICADE) 100 MG injection Inject 5 mg/kg into the vein every 6 (six) weeks.    Historical Provider, MD  Loratadine (CLARITIN) 10 MG CAPS Take by mouth.    Historical Provider, MD  Probiotic Product (FLORA-Q PO) Three tabs By mouth daily     Historical Provider, MD  ranitidine (ZANTAC) 150 MG capsule Take 150 mg by mouth 2 (two) times daily.    Historical Provider, MD  thyroid (ARMOUR) 15 MG tablet Take 15 mg by mouth daily.    Historical Provider, MD  VITAMIN D, ERGOCALCIFEROL, PO Take 5,000 Units by mouth daily. Take one by mouth twice daily     Historical Provider, MD    Family History Family History  Problem Relation Age of Onset  . Cancer Mother     breast  . Congenital heart disease Mother   . Colon polyps Brother     x 3  . Heart disease Father  Social History Social History  Substance Use Topics  . Smoking status: Never Smoker  . Smokeless tobacco: Never Used  . Alcohol use No     Allergies   Codeine; Erythromycin; Ketorolac; Morphine; Naproxen sodium; Penicillins; and Sulfonamide derivatives   Review of Systems Review of Systems  Ten systems reviewed and are negative for acute change, except as noted in the HPI.   Physical Exam Updated Vital Signs BP (!) 151/92 (BP Location: Left Arm)   Pulse 86   Temp 98 F (36.7 C) (Oral)   Resp 16   Ht 5' 6.5" (1.689 m)   Wt 73.7 kg   SpO2 96%   BMI 25.82 kg/m   Physical Exam  Constitutional: She is oriented to person, place, and time. She appears well-developed and well-nourished. No distress.  HENT:  Head: Normocephalic and atraumatic.  Eyes: Conjunctivae are normal. No scleral icterus.    Neck: Normal range of motion.  Cardiovascular: Normal rate, regular rhythm and normal heart sounds.  Exam reveals no gallop and no friction rub.   No murmur heard. Pulmonary/Chest: Effort normal and breath sounds normal. No respiratory distress.  Abdominal: Soft. Bowel sounds are normal. She exhibits no distension and no mass. There is tenderness. There is guarding.  Multiple well healed surgical scars. Exquisitely ttp. Guarding  Neurological: She is alert and oriented to person, place, and time.  Skin: Skin is warm and dry. She is not diaphoretic.  Psychiatric: Her behavior is normal.  Nursing note and vitals reviewed.    ED Treatments / Results  Labs (all labs ordered are listed, but only abnormal results are displayed) Labs Reviewed  COMPREHENSIVE METABOLIC PANEL - Abnormal; Notable for the following:       Result Value   Glucose, Bld 105 (*)    ALT 12 (*)    All other components within normal limits  LIPASE, BLOOD  CBC  URINALYSIS, ROUTINE W REFLEX MICROSCOPIC    EKG  EKG Interpretation None       Radiology No results found.  Procedures Procedures (including critical care time)  Medications Ordered in ED Medications - No data to display   Initial Impression / Assessment and Plan / ED Course  I have reviewed the triage vital signs and the nursing notes.  Pertinent labs & imaging results that were available during my care of the patient were reviewed by me and considered in my medical decision making (see chart for details).    Labs reviewed. + hematuria ddx includes sbo, diverticulitis, kidney stone. CT is pending. HDS I have given report to PA Upstill who will assume care.  Final Clinical Impressions(s) / ED Diagnoses   Final diagnoses:  None    New Prescriptions New Prescriptions   No medications on file     Arthor Captain, PA-C 02/07/17 2019    Lorre Nick, MD 02/11/17 2330

## 2017-02-07 NOTE — ED Notes (Signed)
Jake at bedside for ultrasound iv placement

## 2017-02-07 NOTE — ED Provider Notes (Signed)
LLQ abdominal pain and tenderness Hematuria - without infection No fever.   Diverticulitis vs SBO vs stones H/O colectomy  CT scan pending  CT scan shows large, obstructing kidney stone at the UPJ. Her pain is managed, no vomiting. She reports a history of kidney stones in the past. She does not have a urologist, so will provide referral. She is felt stable for discharge home.    Elpidio Anis, PA-C 02/07/17 2140    Lorre Nick, MD 02/11/17 2330

## 2017-02-08 ENCOUNTER — Encounter (HOSPITAL_COMMUNITY): Payer: Self-pay | Admitting: *Deleted

## 2017-02-08 ENCOUNTER — Other Ambulatory Visit: Payer: Self-pay | Admitting: Urology

## 2017-02-08 DIAGNOSIS — N202 Calculus of kidney with calculus of ureter: Secondary | ICD-10-CM | POA: Diagnosis not present

## 2017-02-08 NOTE — H&P (Signed)
CC: I have a ureteral stone.  HPI: Stephanie Ray is a 76 year-old female patient who was referred by Becky Augusta who is here for a ureteral calculus.  The patient's stone is one her left side. She first noticed the symptoms 02/05/2017. This is her first kidney stone. There is not a history of calculus disease in the family. She is currently having nausea, vomiting, and chills. She denies having flank pain, back pain, groin pain, and fever. She does not have a burning sensation when she urinates. She has not caught a stone in her urine strainer since her symptoms began.   She has never had surgical treatment for calculi in the past.   She presented to the emergency room on 02/07/17 with a 2 day history of left lower quadrant pain with associated anorexia, nausea and vomiting. She did not experience any change in her voiding pattern. The pain at that time was rated 8-9/10. A CT scan was obtained which revealed a 15 mm long by 6.8 mm wide stone located in the upper ureter at the UPJ with some obstruction but contrast was seen past the stone. It has Hounsfield units of 1000. She had no right renal calculi and a few tiny stones on the left-hand side in the lower pole.  She says she continues to have pain that is mainly felt in the left hip region. She was not placed on an alpha blocker.     ALLERGIES: Aleve TABS Codeine Derivatives Erythromycin TABS Ketorolac TABS Morphine Sulfate (PF) SOLN Penicillins Sulfa Drugs    MEDICATIONS: Dulse Flakes POWD Does Not Apply  Ginger CAPS Oral  Ibuprofen 600 MG Oral Tablet Oral  Naltrexone HCl TABS Oral  PredniSONE TABS Oral  Tylenol Arthritis Pain 650 MG TBCR Oral  Vitamin B12 TABS Oral  Vitamin D TABS Oral     GU PSH: None     PSH Notes: Knee Surgery Left, Breast Surgery, Lower Back Surgery, Colostomy Revision, Colostomy Temporary, Parathyroid Surgery, Facial Surgery   NON-GU PSH: Breast Surgery Procedure - Mar 25, 2011 Revise Colostomy - 2011-03-25    GU  PMH: Kidney Stone, Kidney stone on left side - 2013-03-24 Mixed incontinence, Urge and stress incontinence - 24-Mar-2013 Other microscopic hematuria, Microscopic hematuria - 03/24/2013 Personal Hx urinary calculi, Nephrolithiasis - Mar 24, 2013      PMH Notes:  1898-11-07 00:00:00 - Note: Normal Routine History And Physical Senior Citizen [phone])  2011-06-20 09:03:17 - Note: Arthritis   NON-GU PMH: Personal history of other diseases of the digestive system, History of esophageal reflux - 03-24-2013 Personal history of other endocrine, nutritional and metabolic disease, History of hyperparathyroidism - 2013-03-24    FAMILY HISTORY: Death In The Family Father - Runs In Family Death In The Family Mother - Runs In Family Family Health Status Number - Runs In Family Pure Hypercholesterolemia - Brother, Brother, Brother   SOCIAL HISTORY: Marital Status: Married     Notes: Never A Smoker, Marital History - Currently Married, Occupation: Retired, Alcohol, Caffeine Use, Tobacco Use   REVIEW OF SYSTEMS:    GU Review Female:   Patient denies frequent urination, hard to postpone urination, burning /pain with urination, get up at night to urinate, leakage of urine, stream starts and stops, trouble starting your stream, have to strain to urinate, and currently pregnant.  Gastrointestinal (Upper):   Patient denies nausea, vomiting, and indigestion/ heartburn.  Gastrointestinal (Lower):   Patient denies diarrhea and constipation.  Constitutional:   Patient denies night sweats, fatigue, fever, and weight loss.  Skin:   Patient denies skin rash/ lesion and itching.  Eyes:   Patient denies blurred vision and double vision.  Ears/ Nose/ Throat:   Patient denies sore throat and sinus problems.  Hematologic/Lymphatic:   Patient denies swollen glands and easy bruising.  Cardiovascular:   Patient denies leg swelling and chest pains.  Respiratory:   Patient denies cough and shortness of breath.  Endocrine:   Patient denies excessive thirst.   Musculoskeletal:   Patient denies back pain and joint pain.  Neurological:   Patient denies headaches and dizziness.  Psychologic:   Patient denies depression and anxiety.   VITAL SIGNS:      02/08/2017 10:25 AM  Weight 168 lb / 76.2 kg  Height 66 in / 167.64 cm  BP 111/65 mmHg  Pulse 79 /min  BMI 27.1 kg/m   MULTI-SYSTEM PHYSICAL EXAMINATION:    Constitutional: Well-nourished. No physical deformities. Normally developed. Good grooming.   Neck: Neck symmetrical, not swollen. Normal tracheal position.  Respiratory: No labored breathing, no use of accessory muscles.   Cardiovascular: Normal temperature, normal extremity pulses, no swelling, no varicosities.  Lymphatic: No enlargement of neck, axillae, groin.  Skin: No paleness, no jaundice, no cyanosis. No lesion, no ulcer, no rash.  Neurologic / Psychiatric: Oriented to time, oriented to place, oriented to person. No depression, no anxiety, no agitation.  Gastrointestinal: No mass, no tenderness, no rigidity, non obese abdomen.  Eyes: Normal conjunctivae. Normal eyelids.  Ears, Nose, Mouth, and Throat: Left ear no scars, no lesions, no masses. Right ear no scars, no lesions, no masses. Nose no scars, no lesions, no masses. Normal hearing. Normal lips.  Musculoskeletal: Normal gait and station of head and neck.     PAST DATA REVIEWED:  Source Of History:  Patient  Lab Test Review:   BUN/Creatinine  Records Review:   Previous Hospital Records, POC Tool  X-Ray Review: C.T. Abdomen/Pelvis: Reviewed Films. Reviewed Report. Discussed With Patient.    Notes:                     Her creatinine on 01/18/17 was normal at 0.90   PROCEDURES:          Urinalysis w/Scope Dipstick Dipstick Cont'd Micro  Color: Yellow Bilirubin: Neg WBC/hpf: 0 - 5/hpf  Appearance: Cloudy Ketones: Trace RBC/hpf: >60/hpf  Specific Gravity: 1.025 Blood: 3+ Bacteria: Rare (0-9/hpf)  pH: 6.0 Protein: 2+ Cystals: Ca Oxalate  Glucose: Neg Urobilinogen: 0.2 Casts:  NS (Not Seen)    Nitrites: Neg Trichomonas: Not Present    Leukocyte Esterase: Neg Mucous: Not Present      Epithelial Cells: 0 - 5/hpf      Yeast: NS (Not Seen)      Sperm: Not Present    ASSESSMENT:      ICD-10 Details  1 GU:   Calculus Ureter - N20.1 Left, Acute - We discussed the management of urinary stones. These options include observation, ureteroscopy, shockwave lithotripsy, and PCNL. We discussed which options are relevant to these particular stones. We discussed the natural history of stones as well as the complications of untreated stones and the impact on quality of life without treatment as well as with each of the above listed treatments. We also discussed the efficacy of each treatment in its ability to clear the stone burden. With any of these management options I discussed the signs and symptoms of infection and the need for emergent treatment should these be experienced. For each option we discussed  the ability of each procedure to clear the patient of their stone burden. For observation I described the risks which include but are not limited to silent renal damage, life-threatening infection, need for emergent surgery, failure to pass stone, and pain. For ureteroscopy I described the risks which include heart attack, stroke, pulmonary embolus, death, bleeding, infection, damage to contiguous structures, positioning injury, ureteral stricture, ureteral avulsion, ureteral injury, need for ureteral stent, inability to perform ureteroscopy, need for an interval procedure, inability to clear stone burden, stent discomfort and pain. For shockwave lithotripsy I described the risks which include arrhythmia, kidney contusion, kidney hemorrhage, need for transfusion, long-term risk of diabetes or hypertension, back discomfort, flank ecchymosis, flank abrasion, inability to break up stone, inability to pass stone fragments, Steinstrasse, infection associated with obstructing stones, need for  different surgical procedure and possible need for repeat shockwave lithotripsy.   2   Kidney Stone - N20.0 Left, The stones in the lower pole of her left kidney are very small and not causing obstruction.          Notes:   She has elected to proceed with lithotripsy. She understands that it may require a second treatment due to the size of her stone versus possible ureteroscopic management if the stone does not fragment completely. I started her on medical expulsive therapy today and she has pain medication. I have marked the location of the stone on the plain film reconstruction of the CT scan.    PLAN:            Medications Samples: Rapaflo 8 mg capsule 1 capsule PO Q PM Take with your evening meal. #14              Schedule Return Visit/Planned Activity: Other See Visit Notes - Schedule Surgery             Note: I would like to have her scheduled for her lithotripsy tomorrow.

## 2017-02-09 ENCOUNTER — Encounter (HOSPITAL_COMMUNITY): Payer: Self-pay | Admitting: *Deleted

## 2017-02-09 ENCOUNTER — Ambulatory Visit (HOSPITAL_COMMUNITY): Payer: Medicare Other

## 2017-02-09 ENCOUNTER — Ambulatory Visit (HOSPITAL_COMMUNITY)
Admission: RE | Admit: 2017-02-09 | Discharge: 2017-02-09 | Disposition: A | Discharge status: Home / Self Care | Payer: Medicare Other | Source: Ambulatory Visit | Attending: Urology | Admitting: Urology

## 2017-02-09 ENCOUNTER — Encounter (HOSPITAL_COMMUNITY)
Admission: RE | Disposition: A | Discharge status: Home / Self Care | Payer: Self-pay | Source: Ambulatory Visit | Attending: Urology

## 2017-02-09 DIAGNOSIS — Z882 Allergy status to sulfonamides status: Secondary | ICD-10-CM | POA: Insufficient documentation

## 2017-02-09 DIAGNOSIS — Z791 Long term (current) use of non-steroidal anti-inflammatories (NSAID): Secondary | ICD-10-CM | POA: Insufficient documentation

## 2017-02-09 DIAGNOSIS — Z7952 Long term (current) use of systemic steroids: Secondary | ICD-10-CM | POA: Diagnosis not present

## 2017-02-09 DIAGNOSIS — Z79899 Other long term (current) drug therapy: Secondary | ICD-10-CM | POA: Diagnosis not present

## 2017-02-09 DIAGNOSIS — N202 Calculus of kidney with calculus of ureter: Secondary | ICD-10-CM | POA: Diagnosis not present

## 2017-02-09 DIAGNOSIS — N200 Calculus of kidney: Secondary | ICD-10-CM | POA: Diagnosis not present

## 2017-02-09 DIAGNOSIS — N201 Calculus of ureter: Secondary | ICD-10-CM

## 2017-02-09 DIAGNOSIS — Z886 Allergy status to analgesic agent status: Secondary | ICD-10-CM | POA: Insufficient documentation

## 2017-02-09 DIAGNOSIS — Z880 Allergy status to penicillin: Secondary | ICD-10-CM | POA: Insufficient documentation

## 2017-02-09 DIAGNOSIS — Z885 Allergy status to narcotic agent status: Secondary | ICD-10-CM | POA: Diagnosis not present

## 2017-02-09 HISTORY — DX: Personal history of urinary calculi: Z87.442

## 2017-02-09 HISTORY — DX: Chronic kidney disease, unspecified: N18.9

## 2017-02-09 HISTORY — DX: Hypothyroidism, unspecified: E03.9

## 2017-02-09 HISTORY — PX: EXTRACORPOREAL SHOCK WAVE LITHOTRIPSY: SHX1557

## 2017-02-09 SURGERY — LITHOTRIPSY, ESWL
Anesthesia: LOCAL | Laterality: Left

## 2017-02-09 MED ORDER — DIAZEPAM 5 MG PO TABS
10.0000 mg | ORAL_TABLET | ORAL | Status: AC
  Administered 2017-02-09: 10 mg via ORAL
  Filled 2017-02-09: qty 2

## 2017-02-09 MED ORDER — CIPROFLOXACIN HCL 500 MG PO TABS
500.0000 mg | ORAL_TABLET | ORAL | Status: AC
  Administered 2017-02-09: 500 mg via ORAL
  Filled 2017-02-09: qty 1

## 2017-02-09 MED ORDER — SODIUM CHLORIDE 0.9 % IV SOLN
INTRAVENOUS | Status: DC
  Administered 2017-02-09: 13:00:00 via INTRAVENOUS

## 2017-02-09 MED ORDER — DIPHENHYDRAMINE HCL 25 MG PO CAPS
25.0000 mg | ORAL_CAPSULE | ORAL | Status: AC
  Administered 2017-02-09: 25 mg via ORAL
  Filled 2017-02-09: qty 1

## 2017-02-09 NOTE — Interval H&P Note (Signed)
History and Physical Interval Note:  No change in stone position.  02/09/2017 12:46 PM  Franchot Gallo  has presented today for surgery, with the diagnosis of LEFT URETEROPLEVIC JUNCTION STONE  The various methods of treatment have been discussed with the patient and family. After consideration of risks, benefits and other options for treatment, the patient has consented to  Procedure(s): LEFT EXTRACORPOREAL SHOCK WAVE LITHOTRIPSY (ESWL) (Left) as a surgical intervention .  The patient's history has been reviewed, patient examined, no change in status, stable for surgery.  I have reviewed the patient's chart and labs.  Questions were answered to the patient's satisfaction.     Davinder Haff J

## 2017-02-09 NOTE — Discharge Instructions (Signed)
Lithotripsy, Care After °This sheet gives you information about how to care for yourself after your procedure. Your health care provider may also give you more specific instructions. If you have problems or questions, contact your health care provider. °What can I expect after the procedure? °After the procedure, it is common to have: °· Some blood in your urine. This should only last for a few days. °· Soreness in your back, sides, or upper abdomen for a few days. °· Blotches or bruises on your back where the pressure wave entered the skin. °· Pain, discomfort, or nausea when pieces (fragments) of the kidney stone move through the tube that carries urine from the kidney to the bladder (ureter). Stone fragments may pass soon after the procedure, but they may continue to pass for up to 4-8 weeks. °¨ If you have severe pain or nausea, contact your health care provider. This may be caused by a large stone that was not broken up, and this may mean that you need more treatment. °· Some pain or discomfort during urination. °· Some pain or discomfort in the lower abdomen or (in men) at the base of the penis. °Follow these instructions at home: °Medicines  °· Take over-the-counter and prescription medicines only as told by your health care provider. °· If you were prescribed an antibiotic medicine, take it as told by your health care provider. Do not stop taking the antibiotic even if you start to feel better. °· Do not drive for 24 hours if you were given a medicine to help you relax (sedative). °· Do not drive or use heavy machinery while taking prescription pain medicine. °Eating and drinking  °· Drink enough water and fluids to keep your urine clear or pale yellow. This helps any remaining pieces of the stone to pass. It can also help prevent new stones from forming. °· Eat plenty of fresh fruits and vegetables. °· Follow instructions from your health care provider about eating and drinking restrictions. You may be  instructed: °¨ To reduce how much salt (sodium) you eat or drink. Check ingredients and nutrition facts on packaged foods and beverages. °¨ To reduce how much meat you eat. °· Eat the recommended amount of calcium for your age and gender. Ask your health care provider how much calcium you should have. °General instructions  °· Get plenty of rest. °· Most people can resume normal activities 1-2 days after the procedure. Ask your health care provider what activities are safe for you. °· If directed, strain all urine through the strainer that was provided by your health care provider. °¨ Keep all fragments for your health care provider to see. Any stones that are found may be sent to a medical lab for examination. The stone may be as small as a grain of salt. °· Keep all follow-up visits as told by your health care provider. This is important. °Contact a health care provider if: °· You have pain that is severe or does not get better with medicine. °· You have nausea that is severe or does not go away. °· You have blood in your urine longer than your health care provider told you to expect. °· You have more blood in your urine. °· You have pain during urination that does not go away. °· You urinate more frequently than usual and this does not go away. °· You develop a rash or any other possible signs of an allergic reaction. °Get help right away if: °· You have severe   pain in your back, sides, or upper abdomen. °· You have severe pain while urinating. °· Your urine is very dark red. °· You have blood in your stool (feces). °· You cannot pass any urine at all. °· You feel a strong urge to urinate after emptying your bladder. °· You have a fever or chills. °· You develop shortness of breath, difficulty breathing, or chest pain. °· You have severe nausea that leads to persistent vomiting. °· You faint. °Summary °· After this procedure, it is common to have some pain, discomfort, or nausea when pieces (fragments) of the  kidney stone move through the tube that carries urine from the kidney to the bladder (ureter). If this pain or nausea is severe, however, you should contact your health care provider. °· Most people can resume normal activities 1-2 days after the procedure. Ask your health care provider what activities are safe for you. °· Drink enough water and fluids to keep your urine clear or pale yellow. This helps any remaining pieces of the stone to pass, and it can help prevent new stones from forming. °· If directed, strain your urine and keep all fragments for your health care provider to see. Fragments or stones may be as small as a grain of salt. °· Get help right away if you have severe pain in your back, sides, or upper abdomen or have severe pain while urinating. °This information is not intended to replace advice given to you by your health care provider. Make sure you discuss any questions you have with your health care provider. °Document Released: 11/13/2007 Document Revised: 09/14/2016 Document Reviewed: 09/14/2016 °Elsevier Interactive Patient Education © 2017 [employer] ° ° °Moderate Conscious Sedation, Adult, Care After °These instructions provide you with information about caring for yourself after your procedure. Your health care provider may also give you more specific instructions. Your treatment has been planned according to current medical practices, but problems sometimes occur. Call your health care provider if you have any problems or questions after your procedure. °What can I expect after the procedure? °After your procedure, it is common: °· To feel sleepy for several hours. °· To feel clumsy and have poor balance for several hours. °· To have poor judgment for several hours. °· To vomit if you eat too soon. °Follow these instructions at home: °For at least 24 hours after the procedure:  ° °· Do not: °¨ Participate in activities where you could fall or become injured. °¨ Drive. °¨ Use heavy  machinery. °¨ Drink alcohol. °¨ Take sleeping pills or medicines that cause drowsiness. °¨ Make important decisions or sign legal documents. °¨ Take care of children on your own. °· Rest. °Eating and drinking  °· Follow the diet recommended by your health care provider. °· If you vomit: °¨ Drink water, juice, or soup when you can drink without vomiting. °¨ Make sure you have little or no nausea before eating solid foods. °General instructions  °· Have a responsible adult stay with you until you are awake and alert. °· Take over-the-counter and prescription medicines only as told by your health care provider. °· If you smoke, do not smoke without supervision. °· Keep all follow-up visits as told by your health care provider. This is important. °Contact a health care provider if: °· You keep feeling nauseous or you keep vomiting. °· You feel light-headed. °· You develop a rash. °· You have a fever. °Get help right away if: °· You have trouble breathing. °This information   is not intended to replace advice given to you by your health care provider. Make sure you discuss any questions you have with your health care provider. °Document Released: 08/14/2013 Document Revised: 03/28/2016 Document Reviewed: 02/13/2016 °Elsevier Interactive Patient Education © 2017 [employer] ° ° °

## 2017-02-10 ENCOUNTER — Emergency Department (HOSPITAL_COMMUNITY): Payer: Medicare Other

## 2017-02-10 ENCOUNTER — Emergency Department (HOSPITAL_COMMUNITY)
Admission: EM | Admit: 2017-02-10 | Discharge: 2017-02-11 | Disposition: A | Discharge status: Home / Self Care | Payer: Medicare Other | Attending: Emergency Medicine | Admitting: Emergency Medicine

## 2017-02-10 ENCOUNTER — Encounter (HOSPITAL_COMMUNITY): Payer: Self-pay

## 2017-02-10 DIAGNOSIS — R109 Unspecified abdominal pain: Secondary | ICD-10-CM | POA: Diagnosis present

## 2017-02-10 DIAGNOSIS — E039 Hypothyroidism, unspecified: Secondary | ICD-10-CM | POA: Diagnosis not present

## 2017-02-10 DIAGNOSIS — Z79899 Other long term (current) drug therapy: Secondary | ICD-10-CM | POA: Insufficient documentation

## 2017-02-10 DIAGNOSIS — N202 Calculus of kidney with calculus of ureter: Secondary | ICD-10-CM | POA: Diagnosis not present

## 2017-02-10 DIAGNOSIS — I509 Heart failure, unspecified: Secondary | ICD-10-CM | POA: Diagnosis not present

## 2017-02-10 DIAGNOSIS — N189 Chronic kidney disease, unspecified: Secondary | ICD-10-CM | POA: Insufficient documentation

## 2017-02-10 DIAGNOSIS — N132 Hydronephrosis with renal and ureteral calculous obstruction: Secondary | ICD-10-CM | POA: Diagnosis not present

## 2017-02-10 DIAGNOSIS — N200 Calculus of kidney: Secondary | ICD-10-CM

## 2017-02-10 DIAGNOSIS — K5900 Constipation, unspecified: Secondary | ICD-10-CM

## 2017-02-10 LAB — COMPREHENSIVE METABOLIC PANEL
ALT: 12 U/L — ABNORMAL LOW (ref 14–54)
AST: 24 U/L (ref 15–41)
Albumin: 4.3 g/dL (ref 3.5–5.0)
Alkaline Phosphatase: 80 U/L (ref 38–126)
Anion gap: 7 (ref 5–15)
BILIRUBIN TOTAL: 1 mg/dL (ref 0.3–1.2)
BUN: 13 mg/dL (ref 6–20)
CHLORIDE: 102 mmol/L (ref 101–111)
CO2: 26 mmol/L (ref 22–32)
Calcium: 8.9 mg/dL (ref 8.9–10.3)
Creatinine, Ser: 1.07 mg/dL — ABNORMAL HIGH (ref 0.44–1.00)
GFR, EST AFRICAN AMERICAN: 57 mL/min — AB (ref >60)
GFR, EST NON AFRICAN AMERICAN: 49 mL/min — AB (ref >60)
Glucose, Bld: 117 mg/dL — ABNORMAL HIGH (ref 65–99)
POTASSIUM: 3.6 mmol/L (ref 3.5–5.1)
Sodium: 135 mmol/L (ref 135–145)
TOTAL PROTEIN: 7.5 g/dL (ref 6.5–8.1)

## 2017-02-10 LAB — CBC
HEMATOCRIT: 35.8 % — AB (ref 36.0–46.0)
Hemoglobin: 12.2 g/dL (ref 12.0–15.0)
MCH: 31.4 pg (ref 26.0–34.0)
MCHC: 34.1 g/dL (ref 30.0–36.0)
MCV: 92 fL (ref 78.0–100.0)
PLATELETS: 137 10*3/uL — AB (ref 150–400)
RBC: 3.89 MIL/uL (ref 3.87–5.11)
RDW: 13.1 % (ref 11.5–15.5)
WBC: 6.8 10*3/uL (ref 4.0–10.5)

## 2017-02-10 LAB — URINALYSIS, ROUTINE W REFLEX MICROSCOPIC
BACTERIA UA: NONE SEEN
Bilirubin Urine: NEGATIVE
Glucose, UA: NEGATIVE mg/dL
Ketones, ur: 20 mg/dL — AB
Leukocytes, UA: NEGATIVE
NITRITE: NEGATIVE
PH: 6 (ref 5.0–8.0)
Protein, ur: NEGATIVE mg/dL
SPECIFIC GRAVITY, URINE: 1.012 (ref 1.005–1.030)

## 2017-02-10 LAB — LIPASE, BLOOD: LIPASE: 18 U/L (ref 11–51)

## 2017-02-10 MED ORDER — SODIUM CHLORIDE 0.9 % IV BOLUS (SEPSIS)
1000.0000 mL | Freq: Once | INTRAVENOUS | Status: AC
Start: 2017-02-10 — End: 2017-02-10
  Administered 2017-02-10: 1000 mL via INTRAVENOUS

## 2017-02-10 MED ORDER — FLEET ENEMA 7-19 GM/118ML RE ENEM
1.0000 | ENEMA | Freq: Once | RECTAL | Status: AC
  Administered 2017-02-10: 1 via RECTAL
  Filled 2017-02-10: qty 1

## 2017-02-10 MED ORDER — HYDROMORPHONE HCL 2 MG/ML IJ SOLN
0.5000 mg | Freq: Once | INTRAMUSCULAR | Status: AC
  Administered 2017-02-10: 0.5 mg via INTRAVENOUS
  Filled 2017-02-10: qty 1

## 2017-02-10 MED ORDER — IOPAMIDOL (ISOVUE-300) INJECTION 61%
INTRAVENOUS | Status: AC
  Administered 2017-02-10: 80 mL via INTRAVENOUS
  Filled 2017-02-10: qty 100

## 2017-02-10 NOTE — ED Triage Notes (Signed)
Patient reports that she had a lithrotripsy yesterday and has not had a BM in 4 days. Patient c/o left abdominal pain that radiates into the left back.

## 2017-02-10 NOTE — ED Notes (Signed)
Pt on bedside toilet.

## 2017-02-10 NOTE — ED Notes (Signed)
ED Provider at bedside. 

## 2017-02-10 NOTE — ED Notes (Signed)
2 IV attempts unsuccessful. 

## 2017-02-10 NOTE — ED Notes (Signed)
Call items in room per provider  Per nursing note  Waiting provider

## 2017-02-11 MED ORDER — OXYCODONE-ACETAMINOPHEN 5-325 MG PO TABS: 1.0000 | ORAL_TABLET | ORAL | 0 refills | Status: DC | PRN

## 2017-02-11 NOTE — Discharge Instructions (Signed)
CT scan showed kidney stone fragments. There were also constipation.  Try magnesium citrate or Miralax for constipation.  Continue taking your pain and nausea medicine. Return if worse.

## 2017-02-11 NOTE — ED Provider Notes (Signed)
[facility]-EMERGENCY DEPT Provider Note   CSN: [id number] Arrival date & time: 02/10/17  1613     History   Chief Complaint Chief Complaint  Patient presents with  . Abdominal Pain    HPI Stephanie Ray is a 76 y.o. female.  Status post lithotripsy yesterday by Dr. Bjorn Pippin.  Patient now complains of persistent left flank pain with radiation to the left lower quadrant and constipation. No dysuria, hematuria, fever, sweats, chills. She has taken minimal pain medication at home. Severity of pain is moderate.      Past Medical History:  Diagnosis Date  . ALLERGIC RHINITIS   . Anemia   . Anxiety   . Arthritis   . Carpal tunnel syndrome    right  . Chest pain   . CHF (congestive heart failure) (HCC)   . Chronic kidney disease   . Colon polyps    adenomatous  . Cough    chronic  . Depression   . Diverticulitis   . History of kidney stones   . Hyperlipidemia   . Hypothyroidism   . RA (rheumatoid arthritis) (HCC)   . Rupture of transverse colon (HCC)   . Thyroid disease   . UTI (lower urinary tract infection)     Patient Active Problem List   Diagnosis Date Noted  . PAIN IN JOINT, MULTIPLE SITES 08/26/2009  . ANEMIA, IRON DEFICIENCY 07/30/2009  . PERSONAL HISTORY OF COLONIC POLYPS 07/30/2009  . Aneurysm of thoracic aorta (HCC) 03/04/2008  . ALLERGIC RHINITIS 03/04/2008  . PULMONARY NODULE, RIGHT LOWER LOBE 03/04/2008  . SHORTNESS OF BREATH (SOB) 03/04/2008  . COUGH 03/04/2008    Past Surgical History:  Procedure Laterality Date  . BACK SURGERY  1984  . BREAST CYST EXCISION    . COLOSTOMY    . COLOSTOMY REVERSAL    . EXTRACORPOREAL SHOCK WAVE LITHOTRIPSY Left 02/09/2017   Procedure: LEFT EXTRACORPOREAL SHOCK WAVE LITHOTRIPSY (ESWL);  Surgeon: Bjorn Pippin, MD;  Location: [facility];  Service: Urology;  Laterality: Left;  . herniated disc    . MENISCUS REPAIR     left knee torn  . PARATHYROIDECTOMY    . TONSILLECTOMY      OB History    No data available        Home Medications    Prior to Admission medications   Medication Sig Start Date End Date Taking? Authorizing Provider  b complex vitamins tablet Take 1 tablet by mouth daily.   Yes Historical Provider, MD  bisacodyl (DULCOLAX) 5 MG EC tablet Take 15 mg by mouth daily as needed for moderate constipation.   Yes Historical Provider, MD  Cholecalciferol (VITAMIN D-3) 5000 units TABS Take 5,000 Units by mouth daily.   Yes Historical Provider, MD  Loratadine (CLARITIN) 10 MG CAPS Take 10 mg by mouth daily.    Yes Historical Provider, MD  omega-3 acid ethyl esters (LOVAZA) 1 g capsule Take 1 g by mouth 2 (two) times daily.    Yes Historical Provider, MD  ondansetron (ZOFRAN ODT) 4 MG disintegrating tablet Take 1 tablet (4 mg total) by mouth every 8 (eight) hours as needed for nausea or vomiting. 02/07/17  Yes Shari Upstill, PA-C  Plant Sterols and Stanols (CHOLESTOFF PO) Take 1 tablet by mouth 2 (two) times daily.    Yes Historical Provider, MD  ranitidine (ZANTAC) 150 MG capsule Take 150 mg by mouth daily.    Yes Historical Provider, MD  thyroid (ARMOUR) 15 MG tablet Take 15 mg by mouth  daily.   Yes Historical Provider, MD  oxyCODONE-acetaminophen (PERCOCET) 5-325 MG tablet Take 1-2 tablets by mouth every 4 (four) hours as needed. 02/11/17   Donnetta Hutching, MD    Family History Family History  Problem Relation Age of Onset  . Cancer Mother     breast  . Congenital heart disease Mother   . Colon polyps Brother     x 3  . Heart disease Father     Social History Social History  Substance Use Topics  . Smoking status: Never Smoker  . Smokeless tobacco: Never Used  . Alcohol use No     Allergies   Codeine; Erythromycin; Ketorolac; Morphine; Naproxen sodium; Penicillins; and Sulfonamide derivatives   Review of Systems Review of Systems  All other systems reviewed and are negative.    Physical Exam Updated Vital Signs BP 121/76 (BP Location: Right Arm)   Pulse 76   Temp 98.1 F  (36.7 C) (Oral)   Resp 18   Ht 5' 6.5" (1.689 m)   Wt 161 lb (73 kg)   SpO2 97%   BMI 25.60 kg/m   Physical Exam  Constitutional: She is oriented to person, place, and time. She appears well-developed and well-nourished.  HENT:  Head: Normocephalic and atraumatic.  Eyes: Conjunctivae are normal.  Neck: Neck supple.  Cardiovascular: Normal rate and regular rhythm.   Pulmonary/Chest: Effort normal and breath sounds normal.  Abdominal: Soft. Bowel sounds are normal.  Minimal left lower quadrant tenderness  Genitourinary:  Genitourinary Comments: Minimal left flank tenderness  Musculoskeletal: Normal range of motion.  Neurological: She is alert and oriented to person, place, and time.  Skin: Skin is warm and dry.  Psychiatric: She has a normal mood and affect. Her behavior is normal.  Nursing note and vitals reviewed.    ED Treatments / Results  Labs (all labs ordered are listed, but only abnormal results are displayed) Labs Reviewed  COMPREHENSIVE METABOLIC PANEL - Abnormal; Notable for the following:       Result Value   Glucose, Bld 117 (*)    Creatinine, Ser 1.07 (*)    ALT 12 (*)    GFR calc non Af Amer 49 (*)    GFR calc Af Amer 57 (*)    All other components within normal limits  CBC - Abnormal; Notable for the following:    HCT 35.8 (*)    Platelets 137 (*)    All other components within normal limits  URINALYSIS, ROUTINE W REFLEX MICROSCOPIC - Abnormal; Notable for the following:    APPearance HAZY (*)    Hgb urine dipstick SMALL (*)    Ketones, ur 20 (*)    Squamous Epithelial / LPF 0-5 (*)    All other components within normal limits  LIPASE, BLOOD    EKG  EKG Interpretation None       Radiology Dg Abd 1 View  Result Date: 02/09/2017 CLINICAL DATA:  Left-sided kidney stones EXAM: ABDOMEN - 1 VIEW COMPARISON:  CT abdomen and pelvis of 02/07/2017 FINDINGS: The previously CT demonstrated 15 mm calculus within the left renal pelvis may have moved into  the left proximal ureter by plain film. A moderate amount of feces is noted within the colon. The bones are osteopenic. IMPRESSION: The previously noted 15 mm left renal pelvic calculus may have moved into the proximal left ureter. Electronically Signed   By: Dwyane Dee M.D.   On: 02/09/2017 11:37   Ct Abdomen Pelvis W Contrast  Result  Date: 02/10/2017 CLINICAL DATA:  No bowel movement x4 days. Left abdominal pain radiating to the left back. Patient had lithotripsy yesterday. EXAM: CT ABDOMEN AND PELVIS WITH CONTRAST TECHNIQUE: Multidetector CT imaging of the abdomen and pelvis was performed using the standard protocol following bolus administration of intravenous contrast. CONTRAST:  ISOVUE-300 IOPAMIDOL (ISOVUE-300) INJECTION 61% COMPARISON:  02/09/2017 KUB, CT 02/07/2017 FINDINGS: Lower chest: Normal size cardiac chambers. Bibasilar dependent atelectasis. Partial volume averaging of the dome of the right hepatic lobe noted adjacent to the minor fissure. This is likely causing minimal adjacent atelectasis. Small hiatal hernia is noted. Hepatobiliary: Tiny cyst or hemangioma in the right hepatic lobe near the dome is unchanged measuring approximately 6 mm. No gallstones, gallbladder wall thickening, or biliary dilatation. Pancreas: Unremarkable. No pancreatic ductal dilatation or surrounding inflammatory changes. Spleen: Normal in size without focal abnormality. Adrenals/Urinary Tract: Small subcapsular hypodense fluid collection antral laterally along the upper pole the left kidney likely status post lithotripsy and representing subacute subcapsular hematoma. Layering stones are seen in the upper pole the left kidney measuring up to 7 mm length. Mild ureteral enhancement is seen of the proximal ureter and upper pole collecting systems likely inflammatory in etiology. The large 15 mm in length calculus has migrated distally and has almost passed into the bladder. The calculus now measures 19 mm by 3 mm along  the interstitial portion of the left ureter. More proximal calculi are also present consistent with Steinstrasse. The adrenal glands are normal as is the right kidney and right renal collecting system small cyst is seen in the lower pole the right kidney as before measuring 8 mm in diameter. Stomach/Bowel: Moderate fecal residue throughout large bowel consistent with constipation. No evidence of prior left colostomy reversal with chain sutures in the region of the rectosigmoid. Normal appendix. No small bowel dilatation. Vascular/Lymphatic: Aortic atherosclerosis. No enlarged abdominal or pelvic lymph nodes. Reproductive: Uterus and bilateral adnexa are unremarkable. Other: Thinning of the anterior abdominal wall musculature. Small paraumbilical hernia containing fat. Musculoskeletal: Mild levoconvex curvature of the lumbar spine with lower lumbar spondylitic disc space narrowing and endplate changes at L3-4 more pronounced at L5-S1. IMPRESSION: 1. Migration of known left-sided urinary calculi to the distal left ureter and UVJ (Steinstrasse). Persistence of moderate left hydroureteronephrosis with mild urothelial enhancement suggesting inflammation proximally. Nonobstructing layering calculi are noted in the upper pole left kidney measuring up to 7 mm in maximum dimension. 2. Subacute small hypodense subcapsular hematoma of the left kidney status post lithotripsy. No evidence of acute hemorrhage. 3. Tiny cyst or hemangioma in the right hepatic lobe measuring 6 mm. 4. Moderate fecal residue throughout large bowel consistent with constipation. 5. Small left lower quadrant ventral fat containing hernia. 6. Lower lumbar spondylosis. Electronically Signed   By: Tollie Eth M.D.   On: 02/10/2017 18:41    Procedures Procedures (including critical care time)  Medications Ordered in ED Medications  sodium chloride 0.9 % bolus 1,000 mL (0 mLs Intravenous Stopped 02/10/17 2034)  HYDROmorphone (DILAUDID) injection 0.5 mg  (0.5 mg Intravenous Given 02/10/17 1739)  iopamidol (ISOVUE-300) 61 % injection (80 mLs Intravenous Contrast Given 02/10/17 1811)  HYDROmorphone (DILAUDID) injection 0.5 mg (0.5 mg Intravenous Given 02/10/17 2112)  sodium phosphate (FLEET) 7-19 GM/118ML enema 1 enema (1 enema Rectal Given 02/10/17 2358)     Initial Impression / Assessment and Plan / ED Course  I have reviewed the triage vital signs and the nursing notes.  Pertinent labs & imaging results that were available  during my care of the patient were reviewed by me and considered in my medical decision making (see chart for details).     Repeat CT of abdomen and pelvis reveal stone fragments in the distal ureter/UPJ junction/ hydroureter/subacute subcapsular ematoma with associated constipation. I attempted a digital disimpaction, but no stool was evident. A fleets enema was applied. Patient felt better after pain management. She will continue her Percocet and Zofran at home.  Final Clinical Impressions(s) / ED Diagnoses   Final diagnoses:  Kidney stone on left side  Constipation, unspecified constipation type    New Prescriptions New Prescriptions   OXYCODONE-ACETAMINOPHEN (PERCOCET) 5-325 MG TABLET    Take 1-2 tablets by mouth every 4 (four) hours as needed.     Donnetta Hutching, MD 02/11/17 [id number]

## 2017-02-16 ENCOUNTER — Telehealth: Payer: Self-pay | Admitting: Gastroenterology

## 2017-02-16 NOTE — Telephone Encounter (Signed)
Most likely from the pain medicine.  Ducolax suppository (available OTC) once, then fleets enemas x 2 ( one hour apart)  Give her instructions for a miralax bowel prep like she would do if having a colonoscopy.  If finishes the evening portion of the prep and bowels moving well, does not need to do AM dose

## 2017-02-16 NOTE — Telephone Encounter (Signed)
Patient called she has not had a bm in over a week. Has pain LLQ, denies fever. She is able to eat, drink a little. She has tried ducolax, miralax daily x 4 days. She drank one bottle of magnesium citrate but unfortunately vomited this back up. She had gone to ED on 4/6 following lithotripsy for constipation and left sided pain.

## 2017-02-16 NOTE — Telephone Encounter (Signed)
Patient and husband advised, wrote down instructions for tonight and tomorrow's bowel prep. She understands to call the office if is having difficulty with this or does not get any results.

## 2017-02-28 DIAGNOSIS — N200 Calculus of kidney: Secondary | ICD-10-CM | POA: Diagnosis not present

## 2017-03-08 DIAGNOSIS — Z6825 Body mass index (BMI) 25.0-25.9, adult: Secondary | ICD-10-CM | POA: Diagnosis not present

## 2017-03-08 DIAGNOSIS — E538 Deficiency of other specified B group vitamins: Secondary | ICD-10-CM | POA: Diagnosis not present

## 2017-03-08 DIAGNOSIS — G629 Polyneuropathy, unspecified: Secondary | ICD-10-CM | POA: Diagnosis not present

## 2017-03-08 DIAGNOSIS — E663 Overweight: Secondary | ICD-10-CM | POA: Diagnosis not present

## 2017-03-08 DIAGNOSIS — M2550 Pain in unspecified joint: Secondary | ICD-10-CM | POA: Diagnosis not present

## 2017-03-08 DIAGNOSIS — M0609 Rheumatoid arthritis without rheumatoid factor, multiple sites: Secondary | ICD-10-CM | POA: Diagnosis not present

## 2017-05-02 DIAGNOSIS — N200 Calculus of kidney: Secondary | ICD-10-CM | POA: Diagnosis not present

## 2017-05-11 ENCOUNTER — Encounter: Payer: Self-pay | Admitting: Neurology

## 2017-06-15 ENCOUNTER — Encounter: Payer: Self-pay | Admitting: Neurology

## 2017-06-15 ENCOUNTER — Ambulatory Visit (INDEPENDENT_AMBULATORY_CARE_PROVIDER_SITE_OTHER): Payer: Medicare Other | Admitting: Neurology

## 2017-06-15 VITALS — BP 100/68 | HR 77 | Wt 158.5 lb

## 2017-06-15 DIAGNOSIS — G603 Idiopathic progressive neuropathy: Secondary | ICD-10-CM

## 2017-06-15 DIAGNOSIS — E538 Deficiency of other specified B group vitamins: Secondary | ICD-10-CM | POA: Diagnosis not present

## 2017-06-15 DIAGNOSIS — G629 Polyneuropathy, unspecified: Secondary | ICD-10-CM

## 2017-06-15 HISTORY — DX: Polyneuropathy, unspecified: G62.9

## 2017-06-15 NOTE — Progress Notes (Signed)
Reason for visit: Peripheral neuropathy  Stephanie Ray is a 76 y.o. female  History of present illness:  Stephanie Ray is a 84 year old right-handed white female with a history of rheumatoid arthritis, currently followed by Dr. Nickola Major. The patient had initially been followed through [facility], last seen in 2014. The patient was seen by neurology there and underwent nerve conduction studies that revealed a primarily sensory neuropathy. The patient had been treated with methotrexate, CellCept, and Plaquenil in the past. The patient claims that she has had gradual worsening of her symptoms over time. She has had increased discomfort in the feet over the last one year with burning and stinging sensations mainly on the bottoms of the feet. She denies any severe balance troubles, she does have some slight imbalance. She denies any neck or low back pain or pain down the arms or legs. She denies any issues controlling the bowels or the bladder. She denies any numbness in the hands, she does have degenerative arthritis in both hands associated with the rheumatoid arthritis. She claims that the discomfort in the feet is worse if she is up on her feet for a long time or at nighttime when she is trying to rest. She may not sleep well because of this. She is sent to this office for an evaluation. Currently, she is not on any medications for her rheumatoid arthritis, she had recently been on Remicade but this has been stopped due to lack of clinical benefit.  Past Medical History:  Diagnosis Date  . ALLERGIC RHINITIS   . Anemia   . Anxiety   . Arthritis   . Carpal tunnel syndrome    right  . Chest pain   . CHF (congestive heart failure) (HCC)   . Chronic kidney disease   . Colon polyps    adenomatous  . Cough    chronic  . Depression   . Diverticulitis   . History of kidney stones   . Hyperlipidemia   . Hypothyroidism   . Peripheral neuropathy 06/15/2017  . RA (rheumatoid arthritis) (HCC)   .  Rupture of transverse colon (HCC)   . Thyroid disease   . UTI (lower urinary tract infection)     Past Surgical History:  Procedure Laterality Date  . BACK SURGERY  1984  . BREAST CYST EXCISION    . COLOSTOMY    . COLOSTOMY REVERSAL    . EXTRACORPOREAL SHOCK WAVE LITHOTRIPSY Left 02/09/2017   Procedure: LEFT EXTRACORPOREAL SHOCK WAVE LITHOTRIPSY (ESWL);  Surgeon: Bjorn Pippin, MD;  Location: [facility];  Service: Urology;  Laterality: Left;  . herniated disc    . MENISCUS REPAIR     left knee torn  . PARATHYROIDECTOMY    . TONSILLECTOMY      Family History  Problem Relation Age of Onset  . Cancer Mother        breast  . Congenital heart disease Mother   . Colon polyps Brother        x 3  . Heart disease Father     Social history:  reports that she has never smoked. She has never used smokeless tobacco. She reports that she does not drink alcohol or use drugs.  Medications:  Prior to Admission medications   Medication Sig Start Date End Date Taking? Authorizing Provider  b complex vitamins tablet Take 1 tablet by mouth daily.   Yes [provider]  Cholecalciferol (VITAMIN D-3) 5000 units TABS Take 5,000 Units by mouth daily.  Yes [provider]  omega-3 acid ethyl esters (LOVAZA) 1 g capsule Take 1 g by mouth 2 (two) times daily.    Yes [provider]  ranitidine (ZANTAC) 150 MG capsule Take 150 mg by mouth daily.    Yes [provider]  thyroid (ARMOUR) 15 MG tablet Take 15 mg by mouth daily.   Yes [provider]      Allergies  Allergen Reactions  . Codeine Itching  . Erythromycin Itching and Rash  . Ketorolac Other (See Comments)    Severe headaches   . Morphine Itching  . Naproxen Sodium Other (See Comments)    Severe headaches  . Penicillins Itching and Rash  . Sulfonamide Derivatives Itching and Rash    ROS:  Out of a complete 14 system review of symptoms, the patient complains only of the following symptoms,  and all other reviewed systems are negative.  Fatigue Cold intolerance Restless legs, frequent waking Joint pain, joint swelling, aching muscles, walking difficulty, neck pain Memory loss, numbness, weakness Agitation, confusion, anxiety  Blood pressure 100/68, pulse 77, weight 158 lb 8 oz (71.9 kg), SpO2 98 %.  Physical Exam  General: The patient is alert and cooperative at the time of the examination. The patient has a flat affect.  Eyes: Pupils are equal, round, and reactive to light. Discs are flat bilaterally.  Neck: The neck is supple, no carotid bruits are noted.  Respiratory: The respiratory examination is clear.  Cardiovascular: The cardiovascular examination reveals a regular rate and rhythm, no obvious murmurs or rubs are noted.  Skin: Extremities are without significant edema. Degenerative changes are seen in the MP joints of the hands bilaterally.  Neurologic Exam  Mental status: The patient is alert and oriented x 3 at the time of the examination. The patient has apparent normal recent and remote memory, with an apparently normal attention span and concentration ability.  Cranial nerves: Facial symmetry is present. There is good sensation of the face to pinprick and soft touch bilaterally. The strength of the facial muscles and the muscles to head turning and shoulder shrug are normal bilaterally. Speech is well enunciated, no aphasia or dysarthria is noted. Extraocular movements are full. Visual fields are full. The tongue is midline, and the patient has symmetric elevation of the soft palate. No obvious hearing deficits are noted.  Motor: The motor testing reveals 5 over 5 strength of all 4 extremities. Good symmetric motor tone is noted throughout.  Sensory: Sensory testing is intact to pinprick, soft touch and position sense on all 4 extremities. Vibration sensation is normal in their hands, decreased in the feet bilaterally. The patient does not have a definite  stocking pattern pinprick sensory deficit in the legs. No evidence of extinction is noted.  Coordination: Cerebellar testing reveals good finger-nose-finger and heel-to-shin bilaterally.  Gait and station: Gait is normal. Tandem gait is slightly unsteady. Romberg is negative. No drift is seen.  Reflexes: Deep tendon reflexes are symmetric, but are decreased bilaterally. Toes are downgoing bilaterally.   Assessment/Plan:  1. Peripheral neuropathy  2. Rheumatoid arthritis  The patient was determined have a primarily sensory neuropathy at [facility] four years ago. We will recheck nerve conduction studies on both legs and one arm, and EMG on 1 leg. The patient will undergo further blood work evaluation. I have recommended starting Cymbalta for discomfort, the patient is not sure she wants to get on any medications. She has been on gabapentin in the past and this  was apparently not effective.  Marlan Palau MD 06/15/2017 2:02 PM  [facility] [address] [place], [place] [address]  Phone [phone] Fax [phone]

## 2017-06-19 LAB — MULTIPLE MYELOMA PANEL, SERUM
ALBUMIN SERPL ELPH-MCNC: 3.4 g/dL (ref 2.9–4.4)
Albumin/Glob SerPl: 1.1 (ref 0.7–1.7)
Alpha 1: 0.3 g/dL (ref 0.0–0.4)
Alpha2 Glob SerPl Elph-Mcnc: 0.8 g/dL (ref 0.4–1.0)
B-Globulin SerPl Elph-Mcnc: 0.9 g/dL (ref 0.7–1.3)
Gamma Glob SerPl Elph-Mcnc: 1.2 g/dL (ref 0.4–1.8)
Globulin, Total: 3.2 g/dL (ref 2.2–3.9)
IGA/IMMUNOGLOBULIN A, SERUM: 143 mg/dL (ref 64–422)
IGM (IMMUNOGLOBULIN M), SRM: 70 mg/dL (ref 26–217)
IgG (Immunoglobin G), Serum: 1156 mg/dL (ref 700–1600)
Total Protein: 6.6 g/dL (ref 6.0–8.5)

## 2017-06-19 LAB — VITAMIN B12: Vitamin B-12: 306 pg/mL (ref 232–1245)

## 2017-06-19 LAB — ANGIOTENSIN CONVERTING ENZYME: ANGIO CONVERT ENZYME: 34 U/L (ref 14–82)

## 2017-06-19 LAB — B. BURGDORFI ANTIBODIES: Lyme IgG/IgM Ab: <0.91 {ISR} (ref 0.00–0.90)

## 2017-06-27 ENCOUNTER — Ambulatory Visit (INDEPENDENT_AMBULATORY_CARE_PROVIDER_SITE_OTHER): Payer: Self-pay | Admitting: Neurology

## 2017-06-27 ENCOUNTER — Encounter: Payer: Self-pay | Admitting: Neurology

## 2017-06-27 ENCOUNTER — Ambulatory Visit (INDEPENDENT_AMBULATORY_CARE_PROVIDER_SITE_OTHER): Payer: Medicare Other | Admitting: Neurology

## 2017-06-27 DIAGNOSIS — G603 Idiopathic progressive neuropathy: Secondary | ICD-10-CM

## 2017-06-27 MED ORDER — DULOXETINE HCL 30 MG PO CPEP
ORAL_CAPSULE | ORAL | 3 refills | Status: DC
Start: 2017-06-27 — End: 2020-09-14

## 2017-06-27 NOTE — Progress Notes (Signed)
Please refer to EMG and nerve conduction study procedure note. 

## 2017-06-27 NOTE — Procedures (Signed)
     HISTORY:  Stephanie Ray is a 76 year old patient with a history of discomfort in the feet dating back 4 or 5 years, gradually worsening over time. The patient is being evaluated for a possible peripheral neuropathy. The patient has had prior lumbosacral spine surgery.  NERVE CONDUCTION STUDIES:  Nerve conduction studies were performed on the left upper extremity. The distal motor latencies and motor amplitudes for the median and ulnar nerves were normal, with a normal nerve conduction velocities for the left median nerve, slowed below the elbow on the left, normal above the elbow for the ulnar nerve. The sensory latencies for the left median and ulnar nerves were normal. The F wave latencies for these nerves were normal.  Nerve conduction studies were performed on both lower extremities. The distal motor latencies for the peroneal and posterior tibial nerves were normal bilaterally with a low motor amplitude seen for the peroneal nerves bilaterally, normal for the posterior tibial nerves bilaterally. The nerve conduction velocities below the fibular head for the peroneal nerves were slowed, there were normal above the fibular head bilaterally. The nerve conduction velocities for the posterior tibial nerves were normal bilaterally. The peroneal sensory latencies were unobtainable bilaterally. The H reflex latencies were prolonged bilaterally.  EMG STUDIES:  EMG study was performed on the left lower extremity:  The tibialis anterior muscle reveals 2 to 4K motor units with full recruitment. No fibrillations or positive waves were seen. The peroneus tertius muscle reveals 2 to 4K motor units with slightly decreased recruitment. No fibrillations or positive waves were seen. The medial gastrocnemius muscle reveals 1 to 3K motor units with full recruitment. No fibrillations or positive waves were seen. The vastus lateralis muscle reveals 2 to 4K motor units with full recruitment. No fibrillations  or positive waves were seen. The iliopsoas muscle reveals 2 to 4K motor units with full recruitment. No fibrillations or positive waves were seen. The biceps femoris muscle (long head) reveals 2 to 4K motor units with full recruitment. No fibrillations or positive waves were seen. Complex repetitive discharges were seen. The lumbosacral paraspinal muscles were tested at 3 levels, and revealed no abnormalities of insertional activity at all 3 levels tested. There was good relaxation.   IMPRESSION:  Nerve conduction studies done on the left upper extremity and both lower extremities shows evidence of a mild primarily axonal peripheral neuropathy. EMG evaluation of the left lower extremity is relatively unremarkable without evidence of an overlying lumbosacral radiculopathy.  Marlan Palau MD 06/27/2017 11:17 AM  [facility] [address] [place], [place] [address]  Phone [phone] Fax [phone]

## 2017-06-27 NOTE — Progress Notes (Addendum)
The patient came in today for EMG and nerve conduction study evaluation. This shows evidence of a primarily sensory neuropathy. The patient continues to complain of discomfort, she is not sleeping well at night.  We will add Cymbalta to the regimen taking 30 mg at night for 2 weeks and then take one 30 milligrams tablet twice daily.  She will call for any dose adjustments.      MNC    Nerve / Sites Muscle Latency Ref. Amplitude Ref. Rel Amp Segments Distance Velocity Ref. Area    ms ms mV mV %  cm m/s m/s mVms  L Median - APB     Wrist APB 3.5 ?4.4 3.7 ?4.0 100 Wrist - APB 7   14.7     Upper arm APB 7.6  2.9  79.3 Upper arm - Wrist 25 62 ?49 11.9  L Ulnar - ADM     Wrist ADM 2.0 ?3.3 5.8 ?6.0 100 Wrist - ADM 7   20.4     B.Elbow ADM 6.4  4.6  78.6 B.Elbow - Wrist 21 48 ?49 17.1     A.Elbow ADM 8.2  4.0  88.1 A.Elbow - B.Elbow 10 56 ?49 15.0         A.Elbow - Wrist      L Peroneal - EDB     Ankle EDB 5.9 ?6.5 1.3 ?2.0 100 Ankle - EDB 9   3.4     Fib head EDB 14.9  0.8  61 Fib head - Ankle 38 42 ?44 3.6     Pop fossa EDB 16.8  1.0  121 Pop fossa - Fib head 10 55 ?44 3.4         Pop fossa - Ankle      R Peroneal - EDB     Ankle EDB 5.4 ?6.5 0.3 ?2.0 100 Ankle - EDB 9        Fib head EDB 14.7  0.8  237 Fib head - Ankle 39 42 ?44 2.0     Pop fossa EDB 16.4  0.6  80.9 Pop fossa - Fib head 10 58 ?44 2.7         Pop fossa - Ankle      L Tibial - AH     Ankle AH 4.1 ?5.8 5.9 ?4.0 100 Ankle - AH 9   16.1     Pop fossa AH 14.6  5.0  84.2 Pop fossa - Ankle 45 43 ?41 14.8  R Tibial - AH     Ankle AH 5.0 ?5.8 5.9 ?4.0 100 Ankle - AH 9   16.4     Pop fossa AH 15.2  2.1  35.9 Pop fossa - Ankle 43 42 ?41 7.2                 SNC    Nerve / Sites Rec. Site Peak Lat Ref.  Amp Ref. Segments Distance    ms ms V V  cm  R Superficial peroneal - Ankle     Lat leg Ankle NR ?4.4 NR ?6 Lat leg - Ankle 14  L Superficial peroneal - Ankle     Lat leg Ankle NR ?4.4 NR ?6 Lat leg - Ankle 14          SNC    Nerve / Sites Rec. Site Peak Lat Amp Segments Distance    ms V  cm  L Median - Orthodromic (Dig II, Mid palm)     Dig II Wrist 3.4  35 Dig II - Wrist 13  L Ulnar - Orthodromic, (Dig V, Mid palm)     Dig V Wrist 2.7 17 Dig V - Wrist 71         F  Wave    Nerve F Lat Ref.   ms ms  L Median - APB 28.9 ?31.0  L Ulnar - ADM 28.3 ?32.0         H Reflex    Nerve H Lat Lat Hmax   ms ms   Left Right Ref. Left Right Ref.  Tibial - Soleus 36.8 36.8 ?35.0  43.0 ?35.0

## 2017-06-30 ENCOUNTER — Telehealth: Payer: Self-pay | Admitting: Neurology

## 2017-06-30 NOTE — Telephone Encounter (Signed)
Pt called in stated that she started cymbalta 3 days ago but since then she had no bowel movement. She tried stool softener, miralax, suppository but not effective, denies any abdominal pain, N/V. I checked uptodate and it says cymbalta associates with 9-10% constipation side effects.   Pt would like to stop the medication. I am OK with it and she can stop it now since she only on 3 days of low dose. I recommend enema to buy from drug store if her constipation not improve. I will also sent a note to Dr. Anne Hahn to let him know and also to see if he has further medication recommendations. Pt expressed understanding and appreciation.   Marvel Plan, MD PhD Stroke Neurology 06/30/2017 3:08 PM

## 2017-07-19 DIAGNOSIS — E039 Hypothyroidism, unspecified: Secondary | ICD-10-CM | POA: Diagnosis not present

## 2017-07-19 DIAGNOSIS — E785 Hyperlipidemia, unspecified: Secondary | ICD-10-CM | POA: Diagnosis not present

## 2017-07-19 DIAGNOSIS — N182 Chronic kidney disease, stage 2 (mild): Secondary | ICD-10-CM | POA: Diagnosis not present

## 2017-07-19 DIAGNOSIS — E559 Vitamin D deficiency, unspecified: Secondary | ICD-10-CM | POA: Diagnosis not present

## 2017-07-20 DIAGNOSIS — D509 Iron deficiency anemia, unspecified: Secondary | ICD-10-CM | POA: Diagnosis not present

## 2017-07-25 DIAGNOSIS — E559 Vitamin D deficiency, unspecified: Secondary | ICD-10-CM | POA: Diagnosis not present

## 2017-07-25 DIAGNOSIS — D509 Iron deficiency anemia, unspecified: Secondary | ICD-10-CM | POA: Diagnosis not present

## 2017-07-25 DIAGNOSIS — E039 Hypothyroidism, unspecified: Secondary | ICD-10-CM | POA: Diagnosis not present

## 2017-07-25 DIAGNOSIS — E7800 Pure hypercholesterolemia, unspecified: Secondary | ICD-10-CM | POA: Diagnosis not present

## 2017-07-31 DIAGNOSIS — R197 Diarrhea, unspecified: Secondary | ICD-10-CM | POA: Diagnosis not present

## 2017-08-09 DIAGNOSIS — Z23 Encounter for immunization: Secondary | ICD-10-CM | POA: Diagnosis not present

## 2017-08-18 ENCOUNTER — Ambulatory Visit: Payer: Medicare Other | Admitting: Neurology

## 2017-09-07 DIAGNOSIS — Z01419 Encounter for gynecological examination (general) (routine) without abnormal findings: Secondary | ICD-10-CM | POA: Diagnosis not present

## 2017-09-07 DIAGNOSIS — Z6825 Body mass index (BMI) 25.0-25.9, adult: Secondary | ICD-10-CM | POA: Diagnosis not present

## 2017-09-07 DIAGNOSIS — M8588 Other specified disorders of bone density and structure, other site: Secondary | ICD-10-CM | POA: Diagnosis not present

## 2017-09-07 DIAGNOSIS — N958 Other specified menopausal and perimenopausal disorders: Secondary | ICD-10-CM | POA: Diagnosis not present

## 2017-09-11 DIAGNOSIS — M2550 Pain in unspecified joint: Secondary | ICD-10-CM | POA: Diagnosis not present

## 2017-09-11 DIAGNOSIS — Z6824 Body mass index (BMI) 24.0-24.9, adult: Secondary | ICD-10-CM | POA: Diagnosis not present

## 2017-09-11 DIAGNOSIS — E538 Deficiency of other specified B group vitamins: Secondary | ICD-10-CM | POA: Diagnosis not present

## 2017-09-11 DIAGNOSIS — M0609 Rheumatoid arthritis without rheumatoid factor, multiple sites: Secondary | ICD-10-CM | POA: Diagnosis not present

## 2017-09-11 DIAGNOSIS — G629 Polyneuropathy, unspecified: Secondary | ICD-10-CM | POA: Diagnosis not present

## 2017-10-24 DIAGNOSIS — Z87442 Personal history of urinary calculi: Secondary | ICD-10-CM | POA: Diagnosis not present

## 2017-11-03 DIAGNOSIS — H5212 Myopia, left eye: Secondary | ICD-10-CM | POA: Diagnosis not present

## 2017-11-03 DIAGNOSIS — H524 Presbyopia: Secondary | ICD-10-CM | POA: Diagnosis not present

## 2017-11-03 DIAGNOSIS — H5211 Myopia, right eye: Secondary | ICD-10-CM | POA: Diagnosis not present

## 2017-11-03 DIAGNOSIS — H4720 Unspecified optic atrophy: Secondary | ICD-10-CM | POA: Diagnosis not present

## 2017-11-03 DIAGNOSIS — H26493 Other secondary cataract, bilateral: Secondary | ICD-10-CM | POA: Diagnosis not present

## 2017-11-03 DIAGNOSIS — Z1231 Encounter for screening mammogram for malignant neoplasm of breast: Secondary | ICD-10-CM | POA: Diagnosis not present

## 2017-11-03 DIAGNOSIS — H52222 Regular astigmatism, left eye: Secondary | ICD-10-CM | POA: Diagnosis not present

## 2017-12-22 ENCOUNTER — Ambulatory Visit: Payer: Medicare Other | Admitting: Neurology

## 2018-01-23 DIAGNOSIS — E785 Hyperlipidemia, unspecified: Secondary | ICD-10-CM | POA: Diagnosis not present

## 2018-01-23 DIAGNOSIS — E039 Hypothyroidism, unspecified: Secondary | ICD-10-CM | POA: Diagnosis not present

## 2018-01-23 DIAGNOSIS — N390 Urinary tract infection, site not specified: Secondary | ICD-10-CM | POA: Diagnosis not present

## 2018-01-23 DIAGNOSIS — I5032 Chronic diastolic (congestive) heart failure: Secondary | ICD-10-CM | POA: Diagnosis not present

## 2018-01-23 DIAGNOSIS — E559 Vitamin D deficiency, unspecified: Secondary | ICD-10-CM | POA: Diagnosis not present

## 2018-01-29 DIAGNOSIS — E559 Vitamin D deficiency, unspecified: Secondary | ICD-10-CM | POA: Diagnosis not present

## 2018-01-29 DIAGNOSIS — E7800 Pure hypercholesterolemia, unspecified: Secondary | ICD-10-CM | POA: Diagnosis not present

## 2018-01-29 DIAGNOSIS — E039 Hypothyroidism, unspecified: Secondary | ICD-10-CM | POA: Diagnosis not present

## 2018-01-29 DIAGNOSIS — M069 Rheumatoid arthritis, unspecified: Secondary | ICD-10-CM | POA: Diagnosis not present

## 2018-01-29 DIAGNOSIS — Z0000 Encounter for general adult medical examination without abnormal findings: Secondary | ICD-10-CM | POA: Diagnosis not present

## 2018-02-06 DIAGNOSIS — Z1211 Encounter for screening for malignant neoplasm of colon: Secondary | ICD-10-CM | POA: Diagnosis not present

## 2018-02-06 DIAGNOSIS — Z1212 Encounter for screening for malignant neoplasm of rectum: Secondary | ICD-10-CM | POA: Diagnosis not present

## 2018-03-12 DIAGNOSIS — G629 Polyneuropathy, unspecified: Secondary | ICD-10-CM | POA: Diagnosis not present

## 2018-03-12 DIAGNOSIS — Z6823 Body mass index (BMI) 23.0-23.9, adult: Secondary | ICD-10-CM | POA: Diagnosis not present

## 2018-03-12 DIAGNOSIS — M0609 Rheumatoid arthritis without rheumatoid factor, multiple sites: Secondary | ICD-10-CM | POA: Diagnosis not present

## 2018-03-12 DIAGNOSIS — M2550 Pain in unspecified joint: Secondary | ICD-10-CM | POA: Diagnosis not present

## 2018-03-12 DIAGNOSIS — E538 Deficiency of other specified B group vitamins: Secondary | ICD-10-CM | POA: Diagnosis not present

## 2018-06-12 DIAGNOSIS — G629 Polyneuropathy, unspecified: Secondary | ICD-10-CM | POA: Diagnosis not present

## 2018-06-12 DIAGNOSIS — M2550 Pain in unspecified joint: Secondary | ICD-10-CM | POA: Diagnosis not present

## 2018-06-12 DIAGNOSIS — M0609 Rheumatoid arthritis without rheumatoid factor, multiple sites: Secondary | ICD-10-CM | POA: Diagnosis not present

## 2018-08-01 DIAGNOSIS — E559 Vitamin D deficiency, unspecified: Secondary | ICD-10-CM | POA: Diagnosis not present

## 2018-08-01 DIAGNOSIS — E7800 Pure hypercholesterolemia, unspecified: Secondary | ICD-10-CM | POA: Diagnosis not present

## 2018-08-02 DIAGNOSIS — E7800 Pure hypercholesterolemia, unspecified: Secondary | ICD-10-CM | POA: Diagnosis not present

## 2018-08-09 DIAGNOSIS — Z23 Encounter for immunization: Secondary | ICD-10-CM | POA: Diagnosis not present

## 2018-10-02 DIAGNOSIS — R109 Unspecified abdominal pain: Secondary | ICD-10-CM | POA: Diagnosis not present

## 2018-11-09 DIAGNOSIS — H4720 Unspecified optic atrophy: Secondary | ICD-10-CM | POA: Diagnosis not present

## 2018-11-09 DIAGNOSIS — H26493 Other secondary cataract, bilateral: Secondary | ICD-10-CM | POA: Diagnosis not present

## 2018-11-09 DIAGNOSIS — H5212 Myopia, left eye: Secondary | ICD-10-CM | POA: Diagnosis not present

## 2018-11-09 DIAGNOSIS — Z961 Presence of intraocular lens: Secondary | ICD-10-CM | POA: Diagnosis not present

## 2018-12-13 DIAGNOSIS — M2550 Pain in unspecified joint: Secondary | ICD-10-CM | POA: Diagnosis not present

## 2018-12-13 DIAGNOSIS — Z6823 Body mass index (BMI) 23.0-23.9, adult: Secondary | ICD-10-CM | POA: Diagnosis not present

## 2018-12-13 DIAGNOSIS — G629 Polyneuropathy, unspecified: Secondary | ICD-10-CM | POA: Diagnosis not present

## 2018-12-13 DIAGNOSIS — M0609 Rheumatoid arthritis without rheumatoid factor, multiple sites: Secondary | ICD-10-CM | POA: Diagnosis not present

## 2018-12-13 DIAGNOSIS — R5383 Other fatigue: Secondary | ICD-10-CM | POA: Diagnosis not present

## 2018-12-13 DIAGNOSIS — E538 Deficiency of other specified B group vitamins: Secondary | ICD-10-CM | POA: Diagnosis not present

## 2019-06-06 ENCOUNTER — Other Ambulatory Visit: Payer: Self-pay

## 2019-06-13 DIAGNOSIS — M2550 Pain in unspecified joint: Secondary | ICD-10-CM | POA: Diagnosis not present

## 2019-06-13 DIAGNOSIS — E538 Deficiency of other specified B group vitamins: Secondary | ICD-10-CM | POA: Diagnosis not present

## 2019-06-13 DIAGNOSIS — G629 Polyneuropathy, unspecified: Secondary | ICD-10-CM | POA: Diagnosis not present

## 2019-06-13 DIAGNOSIS — M0609 Rheumatoid arthritis without rheumatoid factor, multiple sites: Secondary | ICD-10-CM | POA: Diagnosis not present

## 2019-06-13 DIAGNOSIS — M150 Primary generalized (osteo)arthritis: Secondary | ICD-10-CM | POA: Diagnosis not present

## 2019-07-02 DIAGNOSIS — Z01419 Encounter for gynecological examination (general) (routine) without abnormal findings: Secondary | ICD-10-CM | POA: Diagnosis not present

## 2019-07-02 DIAGNOSIS — Z1231 Encounter for screening mammogram for malignant neoplasm of breast: Secondary | ICD-10-CM | POA: Diagnosis not present

## 2019-07-30 DIAGNOSIS — Z23 Encounter for immunization: Secondary | ICD-10-CM | POA: Diagnosis not present

## 2019-09-11 DIAGNOSIS — M816 Localized osteoporosis [Lequesne]: Secondary | ICD-10-CM | POA: Diagnosis not present

## 2019-11-22 DIAGNOSIS — R197 Diarrhea, unspecified: Secondary | ICD-10-CM | POA: Diagnosis not present

## 2019-11-22 DIAGNOSIS — E559 Vitamin D deficiency, unspecified: Secondary | ICD-10-CM | POA: Diagnosis not present

## 2019-11-22 DIAGNOSIS — R350 Frequency of micturition: Secondary | ICD-10-CM | POA: Diagnosis not present

## 2019-11-22 DIAGNOSIS — E890 Postprocedural hypothyroidism: Secondary | ICD-10-CM | POA: Diagnosis not present

## 2019-11-22 DIAGNOSIS — R5383 Other fatigue: Secondary | ICD-10-CM | POA: Diagnosis not present

## 2019-11-22 DIAGNOSIS — M069 Rheumatoid arthritis, unspecified: Secondary | ICD-10-CM | POA: Diagnosis not present

## 2019-11-22 DIAGNOSIS — Z1389 Encounter for screening for other disorder: Secondary | ICD-10-CM | POA: Diagnosis not present

## 2019-11-22 DIAGNOSIS — R634 Abnormal weight loss: Secondary | ICD-10-CM | POA: Diagnosis not present

## 2019-11-26 DIAGNOSIS — R311 Benign essential microscopic hematuria: Secondary | ICD-10-CM | POA: Diagnosis not present

## 2019-11-26 DIAGNOSIS — R8279 Other abnormal findings on microbiological examination of urine: Secondary | ICD-10-CM | POA: Diagnosis not present

## 2019-11-26 DIAGNOSIS — N200 Calculus of kidney: Secondary | ICD-10-CM | POA: Diagnosis not present

## 2019-11-26 DIAGNOSIS — Z87442 Personal history of urinary calculi: Secondary | ICD-10-CM | POA: Diagnosis not present

## 2019-12-12 DIAGNOSIS — N3946 Mixed incontinence: Secondary | ICD-10-CM | POA: Diagnosis not present

## 2019-12-12 DIAGNOSIS — R3915 Urgency of urination: Secondary | ICD-10-CM | POA: Diagnosis not present

## 2019-12-12 DIAGNOSIS — R8271 Bacteriuria: Secondary | ICD-10-CM | POA: Diagnosis not present

## 2019-12-16 DIAGNOSIS — E538 Deficiency of other specified B group vitamins: Secondary | ICD-10-CM | POA: Diagnosis not present

## 2019-12-16 DIAGNOSIS — M0609 Rheumatoid arthritis without rheumatoid factor, multiple sites: Secondary | ICD-10-CM | POA: Diagnosis not present

## 2019-12-16 DIAGNOSIS — M2550 Pain in unspecified joint: Secondary | ICD-10-CM | POA: Diagnosis not present

## 2019-12-16 DIAGNOSIS — M150 Primary generalized (osteo)arthritis: Secondary | ICD-10-CM | POA: Diagnosis not present

## 2019-12-16 DIAGNOSIS — G629 Polyneuropathy, unspecified: Secondary | ICD-10-CM | POA: Diagnosis not present

## 2019-12-24 DIAGNOSIS — M1712 Unilateral primary osteoarthritis, left knee: Secondary | ICD-10-CM | POA: Diagnosis not present

## 2019-12-24 DIAGNOSIS — M1711 Unilateral primary osteoarthritis, right knee: Secondary | ICD-10-CM | POA: Diagnosis not present

## 2020-01-09 DIAGNOSIS — R8271 Bacteriuria: Secondary | ICD-10-CM | POA: Diagnosis not present

## 2020-01-09 DIAGNOSIS — N3941 Urge incontinence: Secondary | ICD-10-CM | POA: Diagnosis not present

## 2020-01-09 DIAGNOSIS — R3129 Other microscopic hematuria: Secondary | ICD-10-CM | POA: Diagnosis not present

## 2020-01-09 DIAGNOSIS — R3915 Urgency of urination: Secondary | ICD-10-CM | POA: Diagnosis not present

## 2020-01-20 DIAGNOSIS — M1711 Unilateral primary osteoarthritis, right knee: Secondary | ICD-10-CM | POA: Diagnosis not present

## 2020-01-20 DIAGNOSIS — M1712 Unilateral primary osteoarthritis, left knee: Secondary | ICD-10-CM | POA: Diagnosis not present

## 2020-02-18 DIAGNOSIS — M1712 Unilateral primary osteoarthritis, left knee: Secondary | ICD-10-CM | POA: Diagnosis not present

## 2020-02-18 DIAGNOSIS — M25562 Pain in left knee: Secondary | ICD-10-CM | POA: Diagnosis not present

## 2020-02-18 DIAGNOSIS — M25561 Pain in right knee: Secondary | ICD-10-CM | POA: Diagnosis not present

## 2020-02-18 DIAGNOSIS — M1711 Unilateral primary osteoarthritis, right knee: Secondary | ICD-10-CM | POA: Diagnosis not present

## 2020-02-25 DIAGNOSIS — M1712 Unilateral primary osteoarthritis, left knee: Secondary | ICD-10-CM | POA: Diagnosis not present

## 2020-02-25 DIAGNOSIS — M1711 Unilateral primary osteoarthritis, right knee: Secondary | ICD-10-CM | POA: Diagnosis not present

## 2020-02-25 DIAGNOSIS — M25561 Pain in right knee: Secondary | ICD-10-CM | POA: Diagnosis not present

## 2020-02-25 DIAGNOSIS — M25562 Pain in left knee: Secondary | ICD-10-CM | POA: Diagnosis not present

## 2020-03-03 DIAGNOSIS — M1712 Unilateral primary osteoarthritis, left knee: Secondary | ICD-10-CM | POA: Diagnosis not present

## 2020-03-03 DIAGNOSIS — M25562 Pain in left knee: Secondary | ICD-10-CM | POA: Diagnosis not present

## 2020-03-03 DIAGNOSIS — M25561 Pain in right knee: Secondary | ICD-10-CM | POA: Diagnosis not present

## 2020-03-03 DIAGNOSIS — M1711 Unilateral primary osteoarthritis, right knee: Secondary | ICD-10-CM | POA: Diagnosis not present

## 2020-04-02 DIAGNOSIS — M170 Bilateral primary osteoarthritis of knee: Secondary | ICD-10-CM | POA: Diagnosis not present

## 2020-06-15 DIAGNOSIS — M150 Primary generalized (osteo)arthritis: Secondary | ICD-10-CM | POA: Diagnosis not present

## 2020-06-15 DIAGNOSIS — M2550 Pain in unspecified joint: Secondary | ICD-10-CM | POA: Diagnosis not present

## 2020-06-15 DIAGNOSIS — G629 Polyneuropathy, unspecified: Secondary | ICD-10-CM | POA: Diagnosis not present

## 2020-06-15 DIAGNOSIS — M0609 Rheumatoid arthritis without rheumatoid factor, multiple sites: Secondary | ICD-10-CM | POA: Diagnosis not present

## 2020-09-13 ENCOUNTER — Encounter (HOSPITAL_COMMUNITY): Payer: Self-pay

## 2020-09-13 ENCOUNTER — Observation Stay (HOSPITAL_COMMUNITY): Payer: Medicare Other

## 2020-09-13 ENCOUNTER — Observation Stay (HOSPITAL_COMMUNITY)
Admission: EM | Admit: 2020-09-13 | Discharge: 2020-09-14 | Disposition: A | Discharge status: Home / Self Care | Payer: Medicare Other | Attending: Internal Medicine | Admitting: Internal Medicine

## 2020-09-13 ENCOUNTER — Other Ambulatory Visit: Payer: Self-pay

## 2020-09-13 ENCOUNTER — Emergency Department (HOSPITAL_COMMUNITY): Payer: Medicare Other

## 2020-09-13 DIAGNOSIS — E039 Hypothyroidism, unspecified: Secondary | ICD-10-CM

## 2020-09-13 DIAGNOSIS — Z20822 Contact with and (suspected) exposure to covid-19: Secondary | ICD-10-CM | POA: Diagnosis not present

## 2020-09-13 DIAGNOSIS — D509 Iron deficiency anemia, unspecified: Secondary | ICD-10-CM | POA: Diagnosis present

## 2020-09-13 DIAGNOSIS — R110 Nausea: Secondary | ICD-10-CM | POA: Diagnosis not present

## 2020-09-13 DIAGNOSIS — R4182 Altered mental status, unspecified: Secondary | ICD-10-CM | POA: Insufficient documentation

## 2020-09-13 DIAGNOSIS — R42 Dizziness and giddiness: Secondary | ICD-10-CM | POA: Diagnosis not present

## 2020-09-13 DIAGNOSIS — N189 Chronic kidney disease, unspecified: Secondary | ICD-10-CM | POA: Diagnosis not present

## 2020-09-13 DIAGNOSIS — J0120 Acute ethmoidal sinusitis, unspecified: Secondary | ICD-10-CM

## 2020-09-13 DIAGNOSIS — J3489 Other specified disorders of nose and nasal sinuses: Secondary | ICD-10-CM | POA: Diagnosis not present

## 2020-09-13 DIAGNOSIS — I509 Heart failure, unspecified: Secondary | ICD-10-CM | POA: Diagnosis not present

## 2020-09-13 DIAGNOSIS — R112 Nausea with vomiting, unspecified: Secondary | ICD-10-CM | POA: Diagnosis not present

## 2020-09-13 DIAGNOSIS — R404 Transient alteration of awareness: Secondary | ICD-10-CM | POA: Diagnosis not present

## 2020-09-13 DIAGNOSIS — R1111 Vomiting without nausea: Secondary | ICD-10-CM | POA: Diagnosis not present

## 2020-09-13 DIAGNOSIS — R4189 Other symptoms and signs involving cognitive functions and awareness: Secondary | ICD-10-CM

## 2020-09-13 DIAGNOSIS — G319 Degenerative disease of nervous system, unspecified: Secondary | ICD-10-CM | POA: Diagnosis not present

## 2020-09-13 DIAGNOSIS — I6782 Cerebral ischemia: Secondary | ICD-10-CM | POA: Diagnosis not present

## 2020-09-13 DIAGNOSIS — R4020 Unspecified coma: Secondary | ICD-10-CM | POA: Diagnosis not present

## 2020-09-13 LAB — COMPREHENSIVE METABOLIC PANEL
ALT: 9 U/L (ref 0–44)
AST: 15 U/L (ref 15–41)
Albumin: 3.2 g/dL — ABNORMAL LOW (ref 3.5–5.0)
Alkaline Phosphatase: 72 U/L (ref 38–126)
Anion gap: 10 (ref 5–15)
BUN: 13 mg/dL (ref 8–23)
CO2: 22 mmol/L (ref 22–32)
Calcium: 8.4 mg/dL — ABNORMAL LOW (ref 8.9–10.3)
Chloride: 103 mmol/L (ref 98–111)
Creatinine, Ser: 0.52 mg/dL (ref 0.44–1.00)
GFR, Estimated: >60 mL/min (ref >60)
Glucose, Bld: 111 mg/dL — ABNORMAL HIGH (ref 70–99)
Potassium: 4 mmol/L (ref 3.5–5.1)
Sodium: 135 mmol/L (ref 135–145)
Total Bilirubin: 0.4 mg/dL (ref 0.3–1.2)
Total Protein: 7.8 g/dL (ref 6.5–8.1)

## 2020-09-13 LAB — IRON AND TIBC
Iron: 30 ug/dL (ref 28–170)
Saturation Ratios: 11 % (ref 10.4–31.8)
TIBC: 277 ug/dL (ref 250–450)
UIBC: 247 ug/dL

## 2020-09-13 LAB — CBC WITH DIFFERENTIAL/PLATELET
Abs Immature Granulocytes: 0.03 10*3/uL (ref 0.00–0.07)
Basophils Absolute: 0 10*3/uL (ref 0.0–0.1)
Basophils Relative: 0 %
Eosinophils Absolute: 0 10*3/uL (ref 0.0–0.5)
Eosinophils Relative: 0 %
HCT: 33.2 % — ABNORMAL LOW (ref 36.0–46.0)
Hemoglobin: 10.4 g/dL — ABNORMAL LOW (ref 12.0–15.0)
Immature Granulocytes: 1 %
Lymphocytes Relative: 12 %
Lymphs Abs: 0.7 10*3/uL (ref 0.7–4.0)
MCH: 28.4 pg (ref 26.0–34.0)
MCHC: 31.3 g/dL (ref 30.0–36.0)
MCV: 90.7 fL (ref 80.0–100.0)
Monocytes Absolute: 0.1 10*3/uL (ref 0.1–1.0)
Monocytes Relative: 3 %
Neutro Abs: 4.6 10*3/uL (ref 1.7–7.7)
Neutrophils Relative %: 84 %
Platelets: 385 10*3/uL (ref 150–400)
RBC: 3.66 MIL/uL — ABNORMAL LOW (ref 3.87–5.11)
RDW: 14.9 % (ref 11.5–15.5)
WBC: 5.4 10*3/uL (ref 4.0–10.5)
nRBC: 0 % (ref 0.0–0.2)

## 2020-09-13 LAB — URINALYSIS, ROUTINE W REFLEX MICROSCOPIC
Bilirubin Urine: NEGATIVE
Glucose, UA: NEGATIVE mg/dL
Hgb urine dipstick: NEGATIVE
Ketones, ur: NEGATIVE mg/dL
Leukocytes,Ua: NEGATIVE
Nitrite: NEGATIVE
Protein, ur: NEGATIVE mg/dL
Specific Gravity, Urine: 1.011 (ref 1.005–1.030)
pH: 9 — ABNORMAL HIGH (ref 5.0–8.0)

## 2020-09-13 LAB — CBC
HCT: 33.4 % — ABNORMAL LOW (ref 36.0–46.0)
Hemoglobin: 10.5 g/dL — ABNORMAL LOW (ref 12.0–15.0)
MCH: 28.7 pg (ref 26.0–34.0)
MCHC: 31.4 g/dL (ref 30.0–36.0)
MCV: 91.3 fL (ref 80.0–100.0)
Platelets: 408 10*3/uL — ABNORMAL HIGH (ref 150–400)
RBC: 3.66 MIL/uL — ABNORMAL LOW (ref 3.87–5.11)
RDW: 15.2 % (ref 11.5–15.5)
WBC: 5.2 10*3/uL (ref 4.0–10.5)
nRBC: 0 % (ref 0.0–0.2)

## 2020-09-13 LAB — CREATININE, SERUM
Creatinine, Ser: 0.47 mg/dL (ref 0.44–1.00)
GFR, Estimated: >60 mL/min (ref >60)

## 2020-09-13 LAB — RESPIRATORY PANEL BY RT PCR (FLU A&B, COVID)
Influenza A by PCR: NEGATIVE
Influenza B by PCR: NEGATIVE
SARS Coronavirus 2 by RT PCR: NEGATIVE

## 2020-09-13 LAB — TSH: TSH: 0.594 u[IU]/mL (ref 0.350–4.500)

## 2020-09-13 LAB — VITAMIN B12: Vitamin B-12: 325 pg/mL (ref 180–914)

## 2020-09-13 MED ORDER — ENOXAPARIN SODIUM 40 MG/0.4ML ~~LOC~~ SOLN
40.0000 mg | SUBCUTANEOUS | Status: DC
  Filled 2020-09-13: qty 0.4

## 2020-09-13 MED ORDER — ACETAMINOPHEN 325 MG PO TABS
650.0000 mg | ORAL_TABLET | Freq: Three times a day (TID) | ORAL | Status: DC
  Filled 2020-09-13: qty 2

## 2020-09-13 MED ORDER — THYROID 30 MG PO TABS
15.0000 mg | ORAL_TABLET | Freq: Every day | ORAL | Status: DC
  Administered 2020-09-14: 15 mg via ORAL
  Filled 2020-09-13: qty 1

## 2020-09-13 MED ORDER — ACETAMINOPHEN ER 650 MG PO TBCR: 650.0000 mg | EXTENDED_RELEASE_TABLET | Freq: Three times a day (TID) | ORAL | Status: DC

## 2020-09-13 MED ORDER — THYROID 30 MG PO TABS: 15.0000 mg | ORAL_TABLET | Freq: Every day | ORAL | Status: DC

## 2020-09-13 MED ORDER — ONDANSETRON HCL 4 MG PO TABS: 4.0000 mg | ORAL_TABLET | Freq: Four times a day (QID) | ORAL | Status: DC | PRN

## 2020-09-13 MED ORDER — SALINE SPRAY 0.65 % NA SOLN
1.0000 | NASAL | Status: DC | PRN
  Filled 2020-09-13: qty 44

## 2020-09-13 MED ORDER — MECLIZINE HCL 25 MG PO TABS: 25.0000 mg | ORAL_TABLET | Freq: Three times a day (TID) | ORAL | Status: DC | PRN

## 2020-09-13 MED ORDER — MECLIZINE HCL 25 MG PO TABS
12.5000 mg | ORAL_TABLET | Freq: Once | ORAL | Status: AC
  Administered 2020-09-13: 12.5 mg via ORAL
  Filled 2020-09-13: qty 1

## 2020-09-13 MED ORDER — ONDANSETRON HCL 4 MG/2ML IJ SOLN: 4.0000 mg | Freq: Four times a day (QID) | INTRAMUSCULAR | Status: DC | PRN

## 2020-09-13 MED ORDER — OMEGA-3-ACID ETHYL ESTERS 1 G PO CAPS
1.0000 g | ORAL_CAPSULE | Freq: Every day | ORAL | Status: DC
  Administered 2020-09-14: 1 g via ORAL
  Filled 2020-09-13: qty 1

## 2020-09-13 NOTE — ED Provider Notes (Signed)
[facility] EMERGENCY DEPARTMENT Provider Note  CSN: [id number] Arrival date & time: 09/13/20 6295    History Chief Complaint  Patient presents with   Nausea   Dizziness    HPI  Stephanie Ray is a 79 y.o. female with history of mild-moderate dementia presents from home via EMS for evaluation of dizziness. She does not remember too many specifics but per EMS she woke her husband up around 0530 complaining of dizziness, nausea and vomiting. She described the dizziness as room-spinning. She is feeling better now, does not remember if she has had similar symptoms before.    Past Medical History:  Diagnosis Date   ALLERGIC RHINITIS    Anemia    Anxiety    Arthritis    Carpal tunnel syndrome    right   Chest pain    CHF (congestive heart failure) (HCC)    Chronic kidney disease    Ray polyps    adenomatous   Cough    chronic   Depression    Diverticulitis    History of kidney stones    Hyperlipidemia    Hypothyroidism    Peripheral neuropathy 06/15/2017   RA (rheumatoid arthritis) (HCC)    Rupture of transverse Ray (HCC)    Thyroid disease    UTI (lower urinary tract infection)     Past Surgical History:  Procedure Laterality Date   BACK SURGERY  1984   BREAST CYST EXCISION     COLOSTOMY     COLOSTOMY REVERSAL     EXTRACORPOREAL SHOCK WAVE LITHOTRIPSY Left 02/09/2017   Procedure: LEFT EXTRACORPOREAL SHOCK WAVE LITHOTRIPSY (ESWL);  Surgeon: Bjorn Pippin, MD;  Location: [facility];  Service: Urology;  Laterality: Left;   herniated disc     MENISCUS REPAIR     left knee torn   PARATHYROIDECTOMY     TONSILLECTOMY      Family History  Problem Relation Age of Onset   Cancer Mother        breast   Congenital heart disease Mother    Ray polyps Brother        x 3   Heart disease Father     Social History   Tobacco Use   Smoking status: Never Smoker   Smokeless tobacco: Never Used  Substance Use Topics   Alcohol use:  No   Drug use: No     Home Medications Prior to Admission medications   Medication Sig Start Date End Date Taking? Authorizing Provider  b complex vitamins tablet Take 1 tablet by mouth daily.    [provider]  Cholecalciferol (VITAMIN D-3) 5000 units TABS Take 5,000 Units by mouth daily.    [provider]  DULoxetine (CYMBALTA) 30 MG capsule 1 tablet night for 2 weeks, then take 1 twice daily 06/27/17   York Spaniel, MD  omega-3 acid ethyl esters (LOVAZA) 1 g capsule Take 1 g by mouth 2 (two) times daily.     [provider]  ranitidine (ZANTAC) 150 MG capsule Take 150 mg by mouth daily.     [provider]  thyroid (ARMOUR) 15 MG tablet Take 15 mg by mouth daily.    [provider]     Allergies    Codeine, Erythromycin, Ketorolac, Morphine, Naproxen sodium, Penicillins, and Sulfonamide derivatives   Review of Systems   Review of Systems Unable to assess due to mental status.    Physical Exam BP 125/72    Pulse 71  Temp (!) 97.3 F (36.3 C) (Axillary)    Resp 13    Ht 5\' 7"  (1.702 m)    Wt 60.3 kg    SpO2 97%    BMI 20.83 kg/m   Physical Exam Vitals and nursing note reviewed.  Constitutional:      Appearance: Normal appearance.  HENT:     Head: Normocephalic and atraumatic.     Nose: Nose normal.     Mouth/Throat:     Mouth: Mucous membranes are moist.  Eyes:     Extraocular Movements: Extraocular movements intact.     Conjunctiva/sclera: Conjunctivae normal.  Cardiovascular:     Rate and Rhythm: Normal rate.  Pulmonary:     Effort: Pulmonary effort is normal.     Breath sounds: Normal breath sounds.  Abdominal:     General: Abdomen is flat.     Palpations: Abdomen is soft.     Tenderness: There is no abdominal tenderness.  Musculoskeletal:        General: No swelling. Normal range of motion.     Cervical back: Neck supple.  Skin:    General: Skin is warm and dry.  Neurological:     General: No focal  deficit present.     Mental Status: She is alert. Mental status is at baseline.     Cranial Nerves: No cranial nerve deficit.     Sensory: No sensory deficit.     Motor: No weakness.     Comments: Nystagmus with L lateral gaze  Psychiatric:        Mood and Affect: Mood normal.      ED Results / Procedures / Treatments   Labs (all labs ordered are listed, but only abnormal results are displayed) Labs Reviewed  COMPREHENSIVE METABOLIC PANEL - Abnormal; Notable for the following components:      Result Value   Glucose, Bld 111 (*)    Calcium 8.4 (*)    Albumin 3.2 (*)    All other components within normal limits  CBC WITH DIFFERENTIAL/PLATELET - Abnormal; Notable for the following components:   RBC 3.66 (*)    Hemoglobin 10.4 (*)    HCT 33.2 (*)    All other components within normal limits  URINALYSIS, ROUTINE W REFLEX MICROSCOPIC - Abnormal; Notable for the following components:   APPearance CLOUDY (*)    pH 9.0 (*)    All other components within normal limits  RESPIRATORY PANEL BY RT PCR (FLU A&B, COVID)    EKG EKG Interpretation  Date/Time:  Sunday September 13 2020 07:37:06 EST Ventricular Rate:  60 PR Interval:    QRS Duration: 74 QT Interval:  446 QTC Calculation: 446 R Axis:   70 Text Interpretation: Sinus rhythm Normal ECG No significant change since last tracing Confirmed by Susy Frizzle [phone]) on 09/13/2020 8:49:55 AM   Radiology CT Head Wo Contrast  Result Date: 09/13/2020 CLINICAL DATA:  Dizziness, nonspecific. Additional history provided: Dizziness, nausea, vomiting. EXAM: CT HEAD WITHOUT CONTRAST TECHNIQUE: Contiguous axial images were obtained from the base of the skull through the vertex without intravenous contrast. COMPARISON:  No pertinent prior exams are available for comparison. FINDINGS: Brain: Mild generalized cerebral atrophy. There is no acute intracranial hemorrhage. No demarcated cortical infarct. No extra-axial fluid collection. No  evidence of intracranial mass. No midline shift. Partially empty sella turcica. Vascular: No hyperdense vessel.  Atherosclerotic calcifications. Skull: Normal. Negative for fracture or focal lesion. Sinuses/Orbits: Visualized orbits show no acute finding. Partial opacification of posterior right  ethmoid air cells. IMPRESSION: No evidence of acute intracranial abnormality. Mild cerebral atrophy. Right ethmoid sinusitis. Electronically Signed   By: Jackey Loge DO   On: 09/13/2020 08:18    Procedures Procedures  Medications Ordered in the ED Medications  meclizine (ANTIVERT) tablet 12.5 mg (12.5 mg Oral Given 09/13/20 4010)     MDM Rules/Calculators/A&P MDM Patient with symptoms consistent with vertigo. Will check labs, head CT and give a dose of meclizine.  ED Course  I have reviewed the triage vital signs and the nursing notes.  Pertinent labs & imaging results that were available during my care of the patient were reviewed by me and considered in my medical decision making (see chart for details).  Clinical Course as of Sep 13 1054  Wynelle Link Sep 13, 2020  2725 CMP and CBC unremarkable.    [CS]  1049 UA neg. Attempted to get patient up but she was not able to go more than a few feet before vomiting. Will admit for further evaluation including MRI for concerns of possible central vertigo.    [CS]  1050 Spoke with Dr. Ronaldo Miyamoto, Hospitalist, who will evaluate for admission. Also spoke with Husband who recently had a knee surgery and is not able to come to the hospital.    [CS]    Clinical Course User Index [CS] Pollyann Savoy, MD    Final Clinical Impression(s) / ED Diagnoses Final diagnoses:  Vertigo    Rx / DC Orders ED Discharge Orders    None       Pollyann Savoy, MD 09/13/20 1055

## 2020-09-13 NOTE — ED Triage Notes (Signed)
Pt BIB [facility] from home. Patient has hx. Of dementia. Baseline A&O x 1-2( self, place). EMS reports pt woke husband up at 0530 c/o dizziness, nausea and vomiting. Reported emesis-brown. At arrival to ED patient no longer c/o N/V only dizzieness

## 2020-09-13 NOTE — H&P (Signed)
History and Physical    Stephanie Ray QTM:[id number] DOB: 17-Nov-1940 DOA: 09/13/2020  PCP: Thayer Headings, MD (Inactive)  Patient coming from: Home  Chief Complaint: dizziness, vomiting  HPI: Stephanie Ray is a 79 y.o. female with medical history significant of hypothyroidism. Presenting with N/V and dizziness. Pt is a  poor historian. Family states possible dementia, but no official diagnosis. Hx is taken from husband by phone and dtr at bedside. Apparently, the patient woke her husband up at 0530hrs, uspet and dizzy. She started vomiting light brown liquid and was off balance. No falls, syncopal episodes were noted. Her husband immediately called 911. This was all sudden onset. Prior to this morning, she was in normal health w/o any complaints.   ED Course: A CTH was obtained. It was negative for acute process. Infections w/u was negative. She was given meclizine and everything appeared to be ok until she tried to get up. When she did, she was nauseous and started vomiting again. [facility] was called for admission.   Review of Systems:  No CP, dyspnea, palpitations, F, D, sick contacts. She describes her dizziness as room spinning. Review of systems is otherwise negative for all not mentioned in HPI.   PMHx Past Medical History:  Diagnosis Date  . ALLERGIC RHINITIS   . Anemia   . Anxiety   . Arthritis   . Carpal tunnel syndrome    right  . Chest pain   . CHF (congestive heart failure) (HCC)   . Chronic kidney disease   . Colon polyps    adenomatous  . Cough    chronic  . Depression   . Diverticulitis   . History of kidney stones   . Hyperlipidemia   . Hypothyroidism   . Peripheral neuropathy 06/15/2017  . RA (rheumatoid arthritis) (HCC)   . Rupture of transverse colon (HCC)   . Thyroid disease   . UTI (lower urinary tract infection)     PSHx Past Surgical History:  Procedure Laterality Date  . BACK SURGERY  1984  . BREAST CYST EXCISION    . COLOSTOMY    . COLOSTOMY  REVERSAL    . EXTRACORPOREAL SHOCK WAVE LITHOTRIPSY Left 02/09/2017   Procedure: LEFT EXTRACORPOREAL SHOCK WAVE LITHOTRIPSY (ESWL);  Surgeon: Bjorn Pippin, MD;  Location: [facility];  Service: Urology;  Laterality: Left;  . herniated disc    . MENISCUS REPAIR     left knee torn  . PARATHYROIDECTOMY    . TONSILLECTOMY      SocHx  reports that she has never smoked. She has never used smokeless tobacco. She reports that she does not drink alcohol and does not use drugs.  Allergies  Allergen Reactions  . Codeine Itching  . Erythromycin Itching and Rash  . Ketorolac Other (See Comments)    Severe headaches   . Morphine Itching  . Naproxen Sodium Other (See Comments)    Severe headaches  . Penicillins Itching and Rash  . Sulfonamide Derivatives Itching and Rash    FamHx Family History  Problem Relation Age of Onset  . Cancer Mother        breast  . Congenital heart disease Mother   . Colon polyps Brother        x 3  . Heart disease Father     Prior to Admission medications   Medication Sig Start Date End Date Taking? Authorizing Provider  b complex vitamins tablet Take 1 tablet by mouth daily.    [provider]  Cholecalciferol (VITAMIN D-3) 5000 units TABS Take 5,000 Units by mouth daily.    [provider]  DULoxetine (CYMBALTA) 30 MG capsule 1 tablet night for 2 weeks, then take 1 twice daily 06/27/17   York Spaniel, MD  omega-3 acid ethyl esters (LOVAZA) 1 g capsule Take 1 g by mouth 2 (two) times daily.     [provider]  ranitidine (ZANTAC) 150 MG capsule Take 150 mg by mouth daily.     [provider]  thyroid (ARMOUR) 15 MG tablet Take 15 mg by mouth daily.    [provider]    Physical Exam: Vitals:   09/13/20 0825 09/13/20 0900 09/13/20 1000 09/13/20 1030  BP: 140/71 130/85 134/72 125/72  Pulse: (!) 58 67 62 71  Resp: 18 14 17 13   Temp:      TempSrc:      SpO2: 99% 97% 93% 97%  Weight:      Height:         General: 79 y.o. female resting in bed in NAD Eyes: PERRL, normal sclera ENMT: Nares patent w/o discharge, orophaynx clear, dentition normal, ears w/o discharge/lesions/ulcers Neck: Supple, trachea midline Cardiovascular: brady, +S1, S2, no m/g/r, equal pulses throughout Respiratory: CTABL, no w/r/r, normal WOB GI: BS+, NDNT, no masses noted, no organomegaly noted MSK: No ec/c, BLE lymphedema; hand deformities c/w RA Skin: No rashes, bruises, ulcerations noted Neuro: A&O x 3, MSK strg is 4-5/5 in all ext Psyc: flat affect, calm/cooperative  Labs on Admission: I have personally reviewed following labs and imaging studies  CBC: Recent Labs  Lab 09/13/20 0725  WBC 5.4  NEUTROABS 4.6  HGB 10.4*  HCT 33.2*  MCV 90.7  PLT 385   Basic Metabolic Panel: Recent Labs  Lab 09/13/20 0725  NA 135  K 4.0  CL 103  CO2 22  GLUCOSE 111*  BUN 13  CREATININE 0.52  CALCIUM 8.4*   GFR: Estimated Creatinine Clearance: 53.4 mL/min (by C-G formula based on SCr of 0.52 mg/dL). Liver Function Tests: Recent Labs  Lab 09/13/20 0725  AST 15  ALT 9  ALKPHOS 72  BILITOT 0.4  PROT 7.8  ALBUMIN 3.2*   No results for input(s): LIPASE, AMYLASE in the last 168 hours. No results for input(s): AMMONIA in the last 168 hours. Coagulation Profile: No results for input(s): INR, PROTIME in the last 168 hours. Cardiac Enzymes: No results for input(s): CKTOTAL, CKMB, CKMBINDEX, TROPONINI in the last 168 hours. BNP (last 3 results) No results for input(s): PROBNP in the last 8760 hours. HbA1C: No results for input(s): HGBA1C in the last 72 hours. CBG: No results for input(s): GLUCAP in the last 168 hours. Lipid Profile: No results for input(s): CHOL, HDL, LDLCALC, TRIG, CHOLHDL, LDLDIRECT in the last 72 hours. Thyroid Function Tests: No results for input(s): TSH, T4TOTAL, FREET4, T3FREE, THYROIDAB in the last 72 hours. Anemia Panel: No results for input(s): VITAMINB12, FOLATE, FERRITIN,  TIBC, IRON, RETICCTPCT in the last 72 hours. Urine analysis:    Component Value Date/Time   COLORURINE YELLOW 09/13/2020 0923   APPEARANCEUR CLOUDY (A) 09/13/2020 0923   LABSPEC 1.011 09/13/2020 0923   PHURINE 9.0 (H) 09/13/2020 0923   GLUCOSEU NEGATIVE 09/13/2020 0923   HGBUR NEGATIVE 09/13/2020 0923   BILIRUBINUR NEGATIVE 09/13/2020 0923   KETONESUR NEGATIVE 09/13/2020 0923   PROTEINUR NEGATIVE 09/13/2020 0923   NITRITE NEGATIVE 09/13/2020 0923   LEUKOCYTESUR NEGATIVE 09/13/2020 0923    Radiological Exams on Admission: CT Head Wo Contrast  Result Date: 09/13/2020 CLINICAL DATA:  Dizziness, nonspecific. Additional history provided: Dizziness, nausea, vomiting. EXAM: CT HEAD WITHOUT CONTRAST TECHNIQUE: Contiguous axial images were obtained from the base of the skull through the vertex without intravenous contrast. COMPARISON:  No pertinent prior exams are available for comparison. FINDINGS: Brain: Mild generalized cerebral atrophy. There is no acute intracranial hemorrhage. No demarcated cortical infarct. No extra-axial fluid collection. No evidence of intracranial mass. No midline shift. Partially empty sella turcica. Vascular: No hyperdense vessel.  Atherosclerotic calcifications. Skull: Normal. Negative for fracture or focal lesion. Sinuses/Orbits: Visualized orbits show no acute finding. Partial opacification of posterior right ethmoid air cells. IMPRESSION: No evidence of acute intracranial abnormality. Mild cerebral atrophy. Right ethmoid sinusitis. Electronically Signed   By: Jackey Loge DO   On: 09/13/2020 08:18    EKG: Independently reviewed. Sinus brady, no ST changes  Assessment/Plan N/V/Vertigo     - admit to obs, med-surg     - pretty sudden onset per her husband     - CTH is negative for acute process; MRI is pending     - she did not respond to her initial dose of meclizine     - will get PT/OT to assess; check orthostatics     - will continue meclizine and  anti-emetics for now     - check thiamine, B12  Normocytic anemia     - no evidence of bleed     - check iron studies  Hypothyroidism     - check TSH     - continue synthroid  Hx of RA     - not on any specific meds for this     - continue outpt follow up  DVT prophylaxis: lovenox  Code Status: DNR confirmed with husband on phone  Family Communication: With dtr at bedside; husband on phone.   Consults called: None  Status is: Observation  The patient remains OBS appropriate and will d/c before 2 midnights.  Dispo: The patient is from: Home              Anticipated d/c is to: Home              Anticipated d/c date is: 1 day              Patient currently is not medically stable to d/c.  Teddy Spike DO [facility]  If 7PM-7AM, please contact night-coverage [email or web address]  09/13/2020, 10:53 AM

## 2020-09-13 NOTE — ED Notes (Signed)
Spoke to pt's daughter and provided update. Pt's husband had total knee replacement last week. Upon dc, she would prefer to transport pt home.

## 2020-09-13 NOTE — Progress Notes (Signed)
Daughter in visiting patient.

## 2020-09-13 NOTE — ED Notes (Signed)
Call received from pt husband Najai Waszak [phone] requesting rtn call for pt status/updates. [employer]

## 2020-09-13 NOTE — Progress Notes (Signed)
SATURATION QUALIFICATIONS: (This note is used to comply with regulatory documentation for home oxygen)  Patient Saturations on Room Air at Rest = 100%  Patient Saturations on Room Air while Ambulating = 95-100%  Patient Saturations on 0 Liters of oxygen while Ambulating = 95-100%  Please briefly explain why patient needs home oxygen: Patient's Oxygen remained above 95% while ambulating 528 Feet. Denies any dizziness.

## 2020-09-13 NOTE — Progress Notes (Signed)
Patient unable to provide medical history. Spoke with patient's husband regarding MRI clearance.

## 2020-09-13 NOTE — ED Notes (Addendum)
With NT, ambulated pt with walker around bed. Pt had episode of light brown, clear emesis after ~10 ft. Pt able to stand and ambulate with stand-by assist.

## 2020-09-14 ENCOUNTER — Encounter (HOSPITAL_COMMUNITY): Payer: Self-pay

## 2020-09-14 DIAGNOSIS — E039 Hypothyroidism, unspecified: Secondary | ICD-10-CM | POA: Diagnosis not present

## 2020-09-14 DIAGNOSIS — R4189 Other symptoms and signs involving cognitive functions and awareness: Secondary | ICD-10-CM

## 2020-09-14 DIAGNOSIS — J0120 Acute ethmoidal sinusitis, unspecified: Secondary | ICD-10-CM | POA: Diagnosis not present

## 2020-09-14 DIAGNOSIS — R42 Dizziness and giddiness: Secondary | ICD-10-CM | POA: Diagnosis not present

## 2020-09-14 DIAGNOSIS — D509 Iron deficiency anemia, unspecified: Secondary | ICD-10-CM

## 2020-09-14 LAB — COMPREHENSIVE METABOLIC PANEL
ALT: 9 U/L (ref 0–44)
AST: 15 U/L (ref 15–41)
Albumin: 3.1 g/dL — ABNORMAL LOW (ref 3.5–5.0)
Alkaline Phosphatase: 73 U/L (ref 38–126)
Anion gap: 9 (ref 5–15)
BUN: 11 mg/dL (ref 8–23)
CO2: 24 mmol/L (ref 22–32)
Calcium: 8.7 mg/dL — ABNORMAL LOW (ref 8.9–10.3)
Chloride: 102 mmol/L (ref 98–111)
Creatinine, Ser: 0.56 mg/dL (ref 0.44–1.00)
GFR, Estimated: >60 mL/min (ref >60)
Glucose, Bld: 100 mg/dL — ABNORMAL HIGH (ref 70–99)
Potassium: 3.6 mmol/L (ref 3.5–5.1)
Sodium: 135 mmol/L (ref 135–145)
Total Bilirubin: 0.6 mg/dL (ref 0.3–1.2)
Total Protein: 7.5 g/dL (ref 6.5–8.1)

## 2020-09-14 LAB — CBC
HCT: 31.6 % — ABNORMAL LOW (ref 36.0–46.0)
Hemoglobin: 10 g/dL — ABNORMAL LOW (ref 12.0–15.0)
MCH: 28.7 pg (ref 26.0–34.0)
MCHC: 31.6 g/dL (ref 30.0–36.0)
MCV: 90.5 fL (ref 80.0–100.0)
Platelets: 370 10*3/uL (ref 150–400)
RBC: 3.49 MIL/uL — ABNORMAL LOW (ref 3.87–5.11)
RDW: 15.3 % (ref 11.5–15.5)
WBC: 5.8 10*3/uL (ref 4.0–10.5)
nRBC: 0 % (ref 0.0–0.2)

## 2020-09-14 MED ORDER — FLUTICASONE PROPIONATE 50 MCG/ACT NA SUSP: 1.0000 | Freq: Two times a day (BID) | NASAL | 0 refills | Status: AC

## 2020-09-14 MED ORDER — FERROUS SULFATE 325 (65 FE) MG PO TABS: 325.0000 mg | ORAL_TABLET | Freq: Every day | ORAL | Status: DC

## 2020-09-14 MED ORDER — FERROUS SULFATE 325 (65 FE) MG PO TABS: 325.0000 mg | ORAL_TABLET | Freq: Every day | ORAL | 0 refills | Status: AC

## 2020-09-14 MED ORDER — FLUTICASONE PROPIONATE 50 MCG/ACT NA SUSP
1.0000 | Freq: Two times a day (BID) | NASAL | Status: DC
  Filled 2020-09-14: qty 16

## 2020-09-14 NOTE — Care Management Obs Status (Signed)
MEDICARE OBSERVATION STATUS NOTIFICATION   Patient Details  Name: Stephanie Ray MRN: [id number] Date of Birth: 12-Dec-1940   Medicare Observation Status Notification Given:  Yes    Bartholome Bill, RN 09/14/2020, 2:00 PM

## 2020-09-14 NOTE — Discharge Instructions (Signed)

## 2020-09-14 NOTE — Discharge Summary (Addendum)
Physician Discharge Summary  Stephanie Ray UUV:[id number] DOB: [birth year] DOA: 09/13/2020  PCP: Thayer Headings, MD (Inactive)  Admit date: 09/13/2020 Discharge date: 09/14/2020  Admitted From: Home  Disposition:  Home   Recommendations for Outpatient Follow-up and new medication changes:  1. Follow up with Primary Care in 7 days.  2. Added nasal fluticasone for acute right ethmoid sinusitis.  3. Started on oral iron supplements.   I spoke over the phone with the patient's daughter about patient's  condition, plan of care, prognosis and all questions were addressed.  Home Health: yes   Equipment/Devices: na    Discharge Condition: stable  CODE STATUS: DNR   Diet recommendation:  Heart healthy   Brief/Interim Summary: Stephanie Ray was admitted to the hospital with the working diagnosis of nausea and vomiting, associated with vertigo.   79 yo female with past medical history of hypothyroidism and cognitive impairment.  She developed a sudden onset of dizziness around 5:30 in the morning, associated with vomiting and decreased balance.  No loss of consciousness.  Because of severity of her symptoms EMS was called and patient was brought into the hospital for further evaluation.  On her initial physical examination blood pressure 140/71, heart rate 58, respiratory rate 18, oxygen saturation 93%, his lungs were clear to auscultation bilaterally, heart S1-S2, present rhythmic, soft abdomen, no lower extremity edema.  Sodium 135, potassium 4.0, chloride 103, bicarb 22, glucose 111, BUN 13, creatinine 0.52, white count 5.4, hemoglobin 10.4, hematocrit 33.2, platelets 385.  SARS COVID-19 negative.  Urine analysis negative for infection. Head CT with no evidence of acute intracranial abnormality, mild cerebral atrophy, right ethmoid sinusitis.  Brain MRI with mild motion degradation, no evidence of acute intracranial abnormality.  Mild cerebral atrophy and chronic small vessel ischemic  disease. EKG 60 bpm, normal axis, normal intervals, sinus rhythm, no ST segment or T wave changes.  Patient received as needed antiemetics with improvement of her symptoms.  Patient will be discharged with home health services.  1.  Dizziness, vertigo, acute acute right ethmoid sinusitis.  With supportive medical therapy patient's symptoms improved.  She had no further nausea or vomiting.  Patient was able to ambulate with physical therapy. Continue intranasal saline as needed and added twice daily fluticasone for 2 weeks.  Add home health services for outpatient follow-up.  2.  Normocytic anemia/iron deficiency anemia.  Iron stores showed transferrin saturation of 11, serum iron 30, TIBC 277. At low-dose oral ferrous sulfate.  Follow-up as an outpatient.  3.  Hypothyroidism.  Continue levothyroxine.  TSH 0.59.  4.  History of rheumatoid arthritis.  No acute flare, continue outpatient management.  Home health services.    Discharge Diagnoses:  Principal Problem:   Sinusitis, acute ethmoidal Active Problems:   ANEMIA, IRON DEFICIENCY   Hypothyroid   Cognitive impairment    Discharge Instructions   Allergies as of 09/14/2020      Reactions   Codeine Itching   Erythromycin Itching, Rash   Ketorolac Other (See Comments)   Severe headaches   Morphine Itching   Naproxen Sodium Other (See Comments)   Severe headaches   Penicillins Itching, Rash   Sulfonamide Derivatives Itching, Rash      Medication List    STOP taking these medications   DULoxetine 30 MG capsule Commonly known as: Cymbalta     TAKE these medications   acetaminophen 650 MG CR tablet Commonly known as: TYLENOL Take 650 mg by mouth every 8 (eight) hours.   ferrous  sulfate 325 (65 FE) MG tablet Take 1 tablet (325 mg total) by mouth daily with breakfast. Start taking on: September 15, 2020   fluticasone 50 MCG/ACT nasal spray Commonly known as: FLONASE Place 1 spray into both nostrils 2 (two) times  daily for 14 days.   omega-3 acid ethyl esters 1 g capsule Commonly known as: LOVAZA Take 1 g by mouth daily.   QC TUMERIC COMPLEX PO Take 1 tablet by mouth daily.   sodium chloride 0.65 % Soln nasal spray Commonly known as: OCEAN Place 1 spray into both nostrils as needed for congestion.   thyroid 15 MG tablet Commonly known as: ARMOUR Take 15 mg by mouth daily.       Allergies  Allergen Reactions  . Codeine Itching  . Erythromycin Itching and Rash  . Ketorolac Other (See Comments)    Severe headaches   . Morphine Itching  . Naproxen Sodium Other (See Comments)    Severe headaches  . Penicillins Itching and Rash  . Sulfonamide Derivatives Itching and Rash       Procedures/Studies: CT Head Wo Contrast  Result Date: 09/13/2020 CLINICAL DATA:  Dizziness, nonspecific. Additional history provided: Dizziness, nausea, vomiting. EXAM: CT HEAD WITHOUT CONTRAST TECHNIQUE: Contiguous axial images were obtained from the base of the skull through the vertex without intravenous contrast. COMPARISON:  No pertinent prior exams are available for comparison. FINDINGS: Brain: Mild generalized cerebral atrophy. There is no acute intracranial hemorrhage. No demarcated cortical infarct. No extra-axial fluid collection. No evidence of intracranial mass. No midline shift. Partially empty sella turcica. Vascular: No hyperdense vessel.  Atherosclerotic calcifications. Skull: Normal. Negative for fracture or focal lesion. Sinuses/Orbits: Visualized orbits show no acute finding. Partial opacification of posterior right ethmoid air cells. IMPRESSION: No evidence of acute intracranial abnormality. Mild cerebral atrophy. Right ethmoid sinusitis. Electronically Signed   By: Jackey Loge DO   On: 09/13/2020 08:18   MR BRAIN WO CONTRAST  Result Date: 09/13/2020 CLINICAL DATA:  Dizziness, nonspecific. EXAM: MRI HEAD WITHOUT CONTRAST TECHNIQUE: Multiplanar, multiecho pulse sequences of the brain and  surrounding structures were obtained without intravenous contrast. COMPARISON:  Non-contrast head CT 09/13/2020 performed earlier the same day. FINDINGS: Brain: Mild intermittent motion degradation. Mild generalized cerebral atrophy. Mild scattered and ill-defined T2/FLAIR hyperintensity within the cerebral white matter is nonspecific, but compatible with chronic small vessel ischemic disease. There is no acute infarct. No evidence of intracranial mass. No chronic intracranial blood products. No extra-axial fluid collection. No midline shift. Partially empty sella turcica. Vascular: Expected proximal arterial flow voids. Skull and upper cervical spine: No focal suspicious marrow lesion. Sinuses/Orbits: Visualized orbits show no acute finding. Partial T2 hyperintense opacification of a posterior right ethmoid air cell. Trace right maxillary sinus mucosal thickening. IMPRESSION: Mildly motion degraded examination. No evidence of acute intracranial abnormality, including acute infarction. Mild cerebral atrophy and chronic small vessel ischemic disease. Right ethmoid sinusitis. Electronically Signed   By: Jackey Loge DO   On: 09/13/2020 12:48        Subjective: Patient is out of bed to chair, no nausea or vomiting, no vertigo   Discharge Exam: Vitals:   09/14/20 0202 09/14/20 0540  BP: 123/79 113/78  Pulse: 65 64  Resp: 15 16  Temp: 98.1 F (36.7 C) 97.6 F (36.4 C)  SpO2: 100% 97%   Vitals:   09/13/20 1707 09/13/20 2200 09/14/20 0202 09/14/20 0540  BP: (!) 144/72 110/87 123/79 113/78  Pulse: 79 (!) 57 65 64  Resp:  18 15 16   Temp: 97.6 F (36.4 C) 97.8 F (36.6 C) 98.1 F (36.7 C) 97.6 F (36.4 C)  TempSrc: Oral Oral Oral Oral  SpO2: 100% 98% 100% 97%  Weight:      Height:        General: Not in pain or dyspnea Neurology: Awake and alert, non focal  E ENT: no pallor, no icterus, oral mucosa moist Cardiovascular: No JVD. S1-S2 present, rhythmic, no gallops, rubs, or murmurs. +=  non pitting bilateral lower extremity edema. Pulmonary: vesicular breath sounds bilaterally, adequate air movement, no wheezing, rhonchi or rales. Gastrointestinal. Abdomen soft and non tender Skin. No rashes Musculoskeletal: no joint deformities   The results of significant diagnostics from this hospitalization (including imaging, microbiology, ancillary and laboratory) are listed below for reference.     Microbiology: Recent Results (from the past 240 hour(s))  Respiratory Panel by RT PCR (Flu A&B, Covid) - Nasopharyngeal Swab     Status: None   Collection Time: 09/13/20 10:36 AM   Specimen: Nasopharyngeal Swab  Result Value Ref Range Status   SARS Coronavirus 2 by RT PCR NEGATIVE NEGATIVE Final    Comment: (NOTE) SARS-CoV-2 target nucleic acids are NOT DETECTED.  The SARS-CoV-2 RNA is generally detectable in upper respiratoy specimens during the acute phase of infection. The lowest concentration of SARS-CoV-2 viral copies this assay can detect is 131 copies/mL. A negative result does not preclude SARS-Cov-2 infection and should not be used as the sole basis for treatment or other patient management decisions. A negative result may occur with  improper specimen collection/handling, submission of specimen other than nasopharyngeal swab, presence of viral mutation(s) within the areas targeted by this assay, and inadequate number of viral copies (<131 copies/mL). A negative result must be combined with clinical observations, patient history, and epidemiological information. The expected result is Negative.  Fact Sheet for Patients:  https://www.moore.com/  Fact Sheet for Healthcare Providers:  https://www.young.biz/  This test is no t yet approved or cleared by the [place] FDA and  has been authorized for detection and/or diagnosis of SARS-CoV-2 by FDA under an Emergency Use Authorization (EUA). This EUA will remain  in effect  (meaning this test can be used) for the duration of the COVID-19 declaration under Section 564(b)(1) of the Act, 21 U.S.C. section 360bbb-3(b)(1), unless the authorization is terminated or revoked sooner.     Influenza A by PCR NEGATIVE NEGATIVE Final   Influenza B by PCR NEGATIVE NEGATIVE Final    Comment: (NOTE) The Xpert Xpress SARS-CoV-2/FLU/RSV assay is intended as an aid in  the diagnosis of influenza from Nasopharyngeal swab specimens and  should not be used as a sole basis for treatment. Nasal washings and  aspirates are unacceptable for Xpert Xpress SARS-CoV-2/FLU/RSV  testing.  Fact Sheet for Patients: https://www.moore.com/  Fact Sheet for Healthcare Providers: https://www.young.biz/  This test is not yet approved or cleared by the [place] FDA and  has been authorized for detection and/or diagnosis of SARS-CoV-2 by  FDA under an Emergency Use Authorization (EUA). This EUA will remain  in effect (meaning this test can be used) for the duration of the  Covid-19 declaration under Section 564(b)(1) of the Act, 21  U.S.C. section 360bbb-3(b)(1), unless the authorization is  terminated or revoked. Performed at [facility], [address], [place], [place] [address]      Labs: BNP (last 3 results) No results for input(s): BNP in the last 8760 hours. Basic Metabolic Panel: Recent Labs  Lab 09/13/20 0725 09/13/20 1554 09/14/20 0618  NA 135  --  135  K 4.0  --  3.6  CL 103  --  102  CO2 22  --  24  GLUCOSE 111*  --  100*  BUN 13  --  11  CREATININE 0.52 0.47 0.56  CALCIUM 8.4*  --  8.7*   Liver Function Tests: Recent Labs  Lab 09/13/20 0725 09/14/20 0618  AST 15 15  ALT 9 9  ALKPHOS 72 73  BILITOT 0.4 0.6  PROT 7.8 7.5  ALBUMIN 3.2* 3.1*   No results for input(s): LIPASE, AMYLASE in the last 168 hours. No results for input(s): AMMONIA in the last 168 hours. CBC: Recent Labs  Lab  09/13/20 0725 09/13/20 1554 09/14/20 0618  WBC 5.4 5.2 5.8  NEUTROABS 4.6  --   --   HGB 10.4* 10.5* 10.0*  HCT 33.2* 33.4* 31.6*  MCV 90.7 91.3 90.5  PLT 385 408* 370   Cardiac Enzymes: No results for input(s): CKTOTAL, CKMB, CKMBINDEX, TROPONINI in the last 168 hours. BNP: Invalid input(s): POCBNP CBG: No results for input(s): GLUCAP in the last 168 hours. D-Dimer No results for input(s): DDIMER in the last 72 hours. Hgb A1c No results for input(s): HGBA1C in the last 72 hours. Lipid Profile No results for input(s): CHOL, HDL, LDLCALC, TRIG, CHOLHDL, LDLDIRECT in the last 72 hours. Thyroid function studies Recent Labs    09/13/20 1554  TSH 0.594   Anemia work up Recent Labs    09/13/20 1554  VITAMINB12 325  TIBC 277  IRON 30   Urinalysis    Component Value Date/Time   COLORURINE YELLOW 09/13/2020 0923   APPEARANCEUR CLOUDY (A) 09/13/2020 0923   LABSPEC 1.011 09/13/2020 0923   PHURINE 9.0 (H) 09/13/2020 0923   GLUCOSEU NEGATIVE 09/13/2020 0923   HGBUR NEGATIVE 09/13/2020 0923   BILIRUBINUR NEGATIVE 09/13/2020 0923   KETONESUR NEGATIVE 09/13/2020 0923   PROTEINUR NEGATIVE 09/13/2020 0923   NITRITE NEGATIVE 09/13/2020 0923   LEUKOCYTESUR NEGATIVE 09/13/2020 0923   Sepsis Labs Invalid input(s): PROCALCITONIN,  WBC,  LACTICIDVEN Microbiology Recent Results (from the past 240 hour(s))  Respiratory Panel by RT PCR (Flu A&B, Covid) - Nasopharyngeal Swab     Status: None   Collection Time: 09/13/20 10:36 AM   Specimen: Nasopharyngeal Swab  Result Value Ref Range Status   SARS Coronavirus 2 by RT PCR NEGATIVE NEGATIVE Final    Comment: (NOTE) SARS-CoV-2 target nucleic acids are NOT DETECTED.  The SARS-CoV-2 RNA is generally detectable in upper respiratoy specimens during the acute phase of infection. The lowest concentration of SARS-CoV-2 viral copies this assay can detect is 131 copies/mL. A negative result does not preclude SARS-Cov-2 infection and should  not be used as the sole basis for treatment or other patient management decisions. A negative result may occur with  improper specimen collection/handling, submission of specimen other than nasopharyngeal swab, presence of viral mutation(s) within the areas targeted by this assay, and inadequate number of viral copies (<131 copies/mL). A negative result must be combined with clinical observations, patient history, and epidemiological information. The expected result is Negative.  Fact Sheet for Patients:  https://www.moore.com/  Fact Sheet for Healthcare Providers:  https://www.young.biz/  This test is no t yet approved or cleared by the [place] FDA and  has been authorized for detection and/or diagnosis of SARS-CoV-2 by FDA under an Emergency Use Authorization (EUA). This EUA will remain  in effect (meaning this test can be  used) for the duration of the COVID-19 declaration under Section 564(b)(1) of the Act, 21 U.S.C. section 360bbb-3(b)(1), unless the authorization is terminated or revoked sooner.     Influenza A by PCR NEGATIVE NEGATIVE Final   Influenza B by PCR NEGATIVE NEGATIVE Final    Comment: (NOTE) The Xpert Xpress SARS-CoV-2/FLU/RSV assay is intended as an aid in  the diagnosis of influenza from Nasopharyngeal swab specimens and  should not be used as a sole basis for treatment. Nasal washings and  aspirates are unacceptable for Xpert Xpress SARS-CoV-2/FLU/RSV  testing.  Fact Sheet for Patients: https://www.moore.com/  Fact Sheet for Healthcare Providers: https://www.young.biz/  This test is not yet approved or cleared by the [place] FDA and  has been authorized for detection and/or diagnosis of SARS-CoV-2 by  FDA under an Emergency Use Authorization (EUA). This EUA will remain  in effect (meaning this test can be used) for the duration of the  Covid-19 declaration under  Section 564(b)(1) of the Act, 21  U.S.C. section 360bbb-3(b)(1), unless the authorization is  terminated or revoked. Performed at [facility], [address], [place], [place] [address]      Time coordinating discharge: 45 minutes  SIGNED:   Coralie Keens, MD  [facility] 09/14/2020, 12:07 PM

## 2020-09-14 NOTE — Evaluation (Signed)
Physical Therapy Evaluation Patient Details Name: Stephanie Ray MRN: [id number] DOB: [birth year] Today's Date: 09/14/2020   History of Present Illness  RAINA SOLE is a 79 y.o. female with medical history significant of hypothyroidism. Presenting with N/V and dizziness. Pt is a  poor historian. Family states possible dementia, but no official diagnosis.In ED a CTH and MRI was obtained. They were negative for acute process. Infections w/u was negative. She was given meclizine and everything appeared to be ok until she tried to get up. When she did, she was nauseous and started vomiting again.  Clinical Impression  Pt was seen for mobility on RW with help to use correct hand placement and minor safety instructions.  Her plan is to go home with husband who is a recent TKA surgery.  The patient's daughter is present and reports husband does not want HHPT but mainly because pt will not do the therapy.  Follow acutely for as aggressive a therapy program as is possible, and will focus on distances and powering up to stand.    Follow Up Recommendations Home health PT;Supervision for mobility/OOB    Equipment Recommendations  None recommended by PT    Recommendations for Other Services       Precautions / Restrictions Precautions Precautions: Fall Restrictions Weight Bearing Restrictions: No      Mobility  Bed Mobility Overal bed mobility: Needs Assistance Bed Mobility: Supine to Sit     Supine to sit: Supervision     General bed mobility comments: up in chair when PT arrived    Transfers Overall transfer level: Needs assistance Equipment used: Rolling walker (2 wheeled);1 person hand held assist Transfers: Sit to/from Stand Sit to Stand: Min guard;Min assist         General transfer comment: cued hand placement  Ambulation/Gait Ambulation/Gait assistance: Min guard Gait Distance (Feet): 100 Feet Assistive device: Rolling walker (2 wheeled);1 person hand held  assist Gait Pattern/deviations: Step-through pattern;Decreased stride length;Wide base of support;Trunk flexed Gait velocity: reduced Gait velocity interpretation: <1.31 ft/sec, indicative of household ambulator General Gait Details: slow pace but steady progress up the hallway, able to maneuver without assisting her walker  Stairs            Wheelchair Mobility    Modified Rankin (Stroke Patients Only)       Balance Overall balance assessment: Needs assistance Sitting-balance support: No upper extremity supported;Feet supported Sitting balance-Leahy Scale: Good Sitting balance - Comments: pt can reposition herself to help wth standing effort Postural control: Posterior lean Standing balance support: Bilateral upper extremity supported Standing balance-Leahy Scale: Poor Standing balance comment: Pt occassionally leaning on wall for balance while standing at sink to wash her hands                             Pertinent Vitals/Pain Pain Assessment: No/denies pain    Home Living Family/patient expects to be discharged to:: Private residence Living Arrangements: Spouse/significant other Available Help at Discharge: Family;Available 24 hours/day Type of Home: House Home Access: Stairs to enter     Home Layout: One level Home Equipment: Environmental consultant - 2 wheels;Shower seat;Bedside commode Additional Comments: pt was independent with walker, stated husband helped her up the stairs    Prior Function Level of Independence: Independent with assistive device(s)         Comments: RW for gait, husband helps if needed     Hand Dominance   Dominant Hand: Right  Extremity/Trunk Assessment   Upper Extremity Assessment Upper Extremity Assessment: Defer to OT evaluation    Lower Extremity Assessment Lower Extremity Assessment: Overall WFL for tasks assessed (LE strength grossly 4+)    Cervical / Trunk Assessment Cervical / Trunk Assessment: Kyphotic (mild)   Communication   Communication: No difficulties  Cognition Arousal/Alertness: Awake/alert Behavior During Therapy: Flat affect Overall Cognitive Status: History of cognitive impairments - at baseline                                 General Comments: As I left the room I heard her say to her dtr "I need to get out of here" (did not hear why, if this was stated). Per chart pt with dementia, but not officially diagnosed.Pt did well with ADL tasks asked of her.      General Comments General comments (skin integrity, edema, etc.): Pt was seen for mobility on RW with cues for safety but pt is able to walk without support on RW    Exercises     Assessment/Plan    PT Assessment Patient needs continued PT services  PT Problem List Decreased strength;Decreased activity tolerance;Decreased balance;Decreased cognition       PT Treatment Interventions DME instruction;Gait training;Stair training;Functional mobility training;Therapeutic activities;Therapeutic exercise;Balance training;Neuromuscular re-education;Patient/family education    PT Goals (Current goals can be found in the Care Plan section)  Acute Rehab PT Goals Patient Stated Goal: to go home PT Goal Formulation: With family Time For Goal Achievement: 09/21/20 Potential to Achieve Goals: Good    Frequency Min 3X/week   Barriers to discharge Inaccessible home environment;Decreased caregiver support home with husband who has just had recent TKA    Co-evaluation               AM-PAC PT "6 Clicks" Mobility  Outcome Measure Help needed turning from your back to your side while in a flat bed without using bedrails?: None Help needed moving from lying on your back to sitting on the side of a flat bed without using bedrails?: A Little Help needed moving to and from a bed to a chair (including a wheelchair)?: A Little Help needed standing up from a chair using your arms (e.g., wheelchair or bedside chair)?: A  Little Help needed to walk in hospital room?: A Little Help needed climbing 3-5 steps with a railing? : A Lot 6 Click Score: 18    End of Session Equipment Utilized During Treatment: Gait belt Activity Tolerance: Patient tolerated treatment well;Patient limited by fatigue;Treatment limited secondary to medical complications (Comment) Patient left: in chair;with call bell/phone within reach;with chair alarm set Nurse Communication: Mobility status PT Visit Diagnosis: Unsteadiness on feet (R26.81);Muscle weakness (generalized) (M62.81)    Time: 1050-1109 PT Time Calculation (min) (ACUTE ONLY): 19 min   Charges:   PT Evaluation $PT Eval Moderate Complexity: 1 Mod         Ivar Drape 09/14/2020, 12:29 PM  Samul Dada, PT MS Acute Rehab Dept. Number: [facility] [id number] and [facility] [phone]

## 2020-09-14 NOTE — Evaluation (Signed)
Occupational Therapy Evaluation Patient Details Name: Stephanie Ray MRN: [id number] DOB: 12-06-1940 Today's Date: 09/14/2020    History of Present Illness TONNIA BARDIN is a 79 y.o. female with medical history significant of hypothyroidism. Presenting with N/V and dizziness. Pt is a  poor historian. Family states possible dementia, but no official diagnosis.In ED a CTH and MRI was obtained. They were negative for acute process. Infections w/u was negative. She was given meclizine and everything appeared to be ok until she tried to get up. When she did, she was nauseous and started vomiting again.   Clinical Impression   This 79 yo female admitted with above presents to acute OT with PLOF of being able to do all of her basic ADLs from a RW level with S prn. Currently pt is min guard A at a RW level for basic ADLs. She will continue to benefit from acute OT with follow up HHOT. Pt without c/o dizziness when she was up and about with me (even when asked), no emesis with me either. Dtr asks that husband be asked about HH services--I have made CM aware.     Follow Up Recommendations  Home health OT;Supervision/Assistance - 24 hour    Equipment Recommendations  None recommended by OT       Precautions / Restrictions Precautions Precautions: Fall Restrictions Weight Bearing Restrictions: No      Mobility Bed Mobility Overal bed mobility: Needs Assistance Bed Mobility: Supine to Sit     Supine to sit: Supervision          Transfers Overall transfer level: Needs assistance Equipment used: Rolling walker (2 wheeled) Transfers: Sit to/from Stand Sit to Stand: Min guard         General transfer comment: VCs for safe hand placement, increased time to rise    Balance Overall balance assessment: Needs assistance Sitting-balance support: No upper extremity supported;Feet supported Sitting balance-Leahy Scale: Good Sitting balance - Comments: Pt able to lean all the way  forward to ajust socks with feet on floor without LOB   Standing balance support: No upper extremity supported;During functional activity Standing balance-Leahy Scale: Poor Standing balance comment: Pt occassionally leaning on wall for balance while standing at sink to wash her hands                           ADL either performed or assessed with clinical judgement   ADL Overall ADL's : Needs assistance/impaired Eating/Feeding: Independent;Sitting   Grooming: Min guard;Standing;Wash/dry hands;Brushing hair Grooming Details (indicate cue type and reason): sat to wash face and oral hygiene Upper Body Bathing: Set up;Supervision/ safety;Sitting   Lower Body Bathing: Min guard;Sit to/from stand   Upper Body Dressing : Supervision/safety;Set up;Sitting   Lower Body Dressing: Min guard;Sit to/from stand   Toilet Transfer: Min guard;Ambulation;RW;BSC Toilet Transfer Details (indicate cue type and reason): over toilet Toileting- Clothing Manipulation and Hygiene: Min guard;Sitting/lateral lean;Sit to/from Nurse, children's Details (indicate cue type and reason): with dtr in room I talked with pt about safety in shower and that using her shower chair (sitting) would be safer than standing (normally she stands per her report) Functional mobility during ADLs: Min guard       Vision Patient Visual Report: No change from baseline              Pertinent Vitals/Pain Pain Assessment: No/denies pain     Hand Dominance Right   Extremity/Trunk Assessment  Upper Extremity Assessment Upper Extremity Assessment: Overall WFL for tasks assessed           Communication Communication Communication: No difficulties   Cognition Arousal/Alertness: Awake/alert Behavior During Therapy: Flat affect (most of time, occassional smile) Overall Cognitive Status: History of cognitive impairments - at baseline                                 General Comments:  As I left the room I heard her say to her dtr "I need to get out of here" (did not hear why, if this was stated). Per chart pt with dementia, but not officially diagnosed.Pt did well with ADL tasks asked of her.              Home Living Family/patient expects to be discharged to:: Private residence Living Arrangements: Spouse/significant other Available Help at Discharge: Family;Available 24 hours/day Type of Home: House             Bathroom Shower/Tub: Walk-in shower   Bathroom Toilet: Handicapped height     Home Equipment: Environmental consultant - 2 wheels;Shower seat;Bedside commode      Husband with recent TKR (last week)    Prior Functioning/Environment Level of Independence: Independent with assistive device(s)        Comments: Does not cook or drive,uses RW        OT Problem List: Impaired balance (sitting and/or standing)      OT Treatment/Interventions: Self-care/ADL training;DME and/or AE instruction;Patient/family education;Balance training    OT Goals(Current goals can be found in the care plan section) Acute Rehab OT Goals Patient Stated Goal: to go home OT Goal Formulation: With patient Time For Goal Achievement: 09/28/20 Potential to Achieve Goals: Good  OT Frequency: Min 2X/week              AM-PAC OT "6 Clicks" Daily Activity     Outcome Measure Help from another person eating meals?: None Help from another person taking care of personal grooming?: A Little Help from another person toileting, which includes using toliet, bedpan, or urinal?: A Little Help from another person bathing (including washing, rinsing, drying)?: A Little Help from another person to put on and taking off regular upper body clothing?: A Little Help from another person to put on and taking off regular lower body clothing?: A Little 6 Click Score: 19   End of Session Equipment Utilized During Treatment: Gait belt;Rolling walker  Activity Tolerance: Patient tolerated treatment  well Patient left: in chair;with call bell/phone within reach;with chair alarm set  OT Visit Diagnosis: Unsteadiness on feet (R26.81);Other abnormalities of gait and mobility (R26.89);Muscle weakness (generalized) (M62.81)                Time: 1000-1038 OT Time Calculation (min): 38 min Charges:  OT General Charges $OT Visit: 1 Visit OT Evaluation $OT Eval Moderate Complexity: 1 Mod OT Treatments $Self Care/Home Management : 8-22 mins  Ignacia Palma, OTR/L Acute [employer] Pager [phone] Office [phone]     Evette Georges 09/14/2020, 10:56 AM

## 2020-09-14 NOTE — Progress Notes (Signed)
Stephanie Ray sitting up in chair for lunch.

## 2020-09-14 NOTE — TOC Initial Note (Signed)
Transition of Care [facility]) - Initial/Assessment Note    Patient Details  Ray: Stephanie Ray MRN: [id number] Date of Birth: Oct 12, 1941  Transition of [facility]) CM/SW Contact:    Bartholome Bill, RN Phone Number: 09/14/2020, 2:08 PM  Clinical Narrative:                 HHPT/OT ordered by MD. Sherron Monday with pt at bedside to inquire about desire for [facility] services. Pt defers decision to husband. Per PT note pt husband declines home health services due to anticipation of pt being noncompliant with PT/OT at home.  Expected Discharge Plan: Home/Self Care Barriers to Discharge: No Barriers Identified   Expected Discharge Plan and Services Expected Discharge Plan: Home/Self Care         Expected Discharge Date: 09/14/20                     Activities of Daily Living Home Assistive Devices/Equipment: Dan Humphreys (specify type) ADL Screening (condition at time of admission) Patient's cognitive ability adequate to safely complete daily activities?: Yes (sometimes needs help) Is the patient deaf or have difficulty hearing?: No Does the patient have difficulty seeing, even when wearing glasses/contacts?: No Does the patient have difficulty concentrating, remembering, or making decisions?: No Patient able to express need for assistance with ADLs?: Yes Does the patient have difficulty dressing or bathing?: Yes (sometimes needs help) Independently performs ADLs?: No Communication: Independent Dressing (OT): Needs assistance Is this a change from baseline?: Pre-admission baseline Grooming: Needs assistance Is this a change from baseline?: Pre-admission baseline Feeding: Independent Bathing: Needs assistance Is this a change from baseline?: Pre-admission baseline Toileting: Needs assistance Is this a change from baseline?: Pre-admission baseline In/Out Bed: Needs assistance Is this a change from baseline?: Change from baseline, expected to last >3 days Walks in Home: Needs assistance  (walker) Is this a change from baseline?: Pre-admission baseline Does the patient have difficulty walking or climbing stairs?: Yes Weakness of Legs: Both (has arthritis) Weakness of Arms/Hands: Both (has arthritis)  Permission Sought/Granted                  Emotional Assessment   Attitude/Demeanor/Rapport: Guarded Affect (typically observed): Flat        Admission diagnosis:  Vertigo [R42] Nausea & vomiting [R11.2] Patient Active Problem List   Diagnosis Date Noted  . Hypothyroid 09/14/2020  . Cognitive impairment 09/14/2020  . Sinusitis, acute ethmoidal 09/14/2020  . Nausea & vomiting 09/13/2020  . Peripheral neuropathy 06/15/2017  . PAIN IN JOINT, MULTIPLE SITES 08/26/2009  . ANEMIA, IRON DEFICIENCY 07/30/2009  . PERSONAL HISTORY OF COLONIC POLYPS 07/30/2009  . Aneurysm of thoracic aorta (HCC) 03/04/2008  . ALLERGIC RHINITIS 03/04/2008  . PULMONARY NODULE, RIGHT LOWER LOBE 03/04/2008  . SHORTNESS OF BREATH (SOB) 03/04/2008  . COUGH 03/04/2008   PCP:  Deatra James, MD Pharmacy:   [facility] [address] [place], [place] - [address] [place] [address] Phone: [phone] Fax: [phone]     Social Determinants of Health (SDOH) Interventions    Readmission Risk Interventions No flowsheet data found.

## 2020-09-18 LAB — VITAMIN B1: Vitamin B1 (Thiamine): 148.1 nmol/L (ref 66.5–200.0)

## 2020-09-23 DIAGNOSIS — R42 Dizziness and giddiness: Secondary | ICD-10-CM | POA: Diagnosis not present

## 2020-09-23 DIAGNOSIS — E539 Vitamin B deficiency, unspecified: Secondary | ICD-10-CM | POA: Diagnosis not present

## 2020-09-23 DIAGNOSIS — M353 Polymyalgia rheumatica: Secondary | ICD-10-CM | POA: Diagnosis not present

## 2020-09-23 DIAGNOSIS — D649 Anemia, unspecified: Secondary | ICD-10-CM | POA: Diagnosis not present

## 2020-09-23 DIAGNOSIS — E039 Hypothyroidism, unspecified: Secondary | ICD-10-CM | POA: Diagnosis not present

## 2020-09-23 DIAGNOSIS — E538 Deficiency of other specified B group vitamins: Secondary | ICD-10-CM | POA: Diagnosis not present

## 2020-09-23 DIAGNOSIS — Z1389 Encounter for screening for other disorder: Secondary | ICD-10-CM | POA: Diagnosis not present

## 2020-09-23 DIAGNOSIS — M069 Rheumatoid arthritis, unspecified: Secondary | ICD-10-CM | POA: Diagnosis not present

## 2023-09-14 NOTE — Progress Notes (Signed)
CARDIOLOGY CONSULT NOTE       Patient ID: Stephanie Ray MRN: [id number] DOB/AGE: [birth year] 82 y.o.  Admit date: (Not on file) Referring Physician: Sun Primary Physician: Deatra James, MD Primary Cardiologist: New Reason for Consultation: Diastolic CHF  Active Problems:   * No active hospital problems. *   HPI:  82 y.o. referred by Dr Wynelle Link for chronic diastolic CHF. She has a history of hypothyroidism, anemia, cognitive impairment, RA, Recent Cr 0.61 K 4.5 Hct 34.9.  TSH 1.2 LDL 191 She has had prior dx of "CHF: and has more LE edema. She is not on diuretic or any BP meds. Renal function is normal She needs assistance with ADL. She is DNR She was supposed to be on crestor 20 mg but ? Restarted in July  She saw Dr Allyson Sabal in 2019 ? Aneurysm but CTA only showed aorta 3.7 cm   She really does not complain of LE edema She has "thick" legs but no pitting and likely more mild lymphedema. She is not very ambulatory She has no dyspnea.   I have seen her daughters husband as patient before   ROS All other systems reviewed and negative except as noted above  Past Medical History:  Diagnosis Date   ALLERGIC RHINITIS    Anemia    Anxiety    Arthritis    Carpal tunnel syndrome    right   Chest pain    CHF (congestive heart failure) (HCC)    Chronic kidney disease    Colon polyps    adenomatous   Cough    chronic   Depression    Diverticulitis    History of kidney stones    Hyperlipidemia    Hypothyroidism    Peripheral neuropathy 06/15/2017   RA (rheumatoid arthritis) (HCC)    Rupture of transverse colon (HCC)    Thyroid disease    UTI (lower urinary tract infection)     Family History  Problem Relation Age of Onset   Cancer Mother        breast   Congenital heart disease Mother    Colon polyps Brother        x 3   Heart disease Father     Social History   Socioeconomic History   Marital status: Married    Spouse name: Not on file   Number of children: 1   Years of  education: 12   Highest education level: Not on file  Occupational History   Occupation: Retired    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  Tobacco Use   Smoking status: Never   Smokeless tobacco: Never  Substance and Sexual Activity   Alcohol use: No   Drug use: No   Sexual activity: Not Currently  Other Topics Concern   Not on file  Social History Narrative   Lives with husband   Caffeine use:  Drinks tea sometimes   Right handed    Social Determinants of Health   Financial Resource Strain: Not on file  Food Insecurity: Not on file  Transportation Needs: Not on file  Physical Activity: Not on file  Stress: Not on file  Social Connections: Not on file  Intimate Partner Violence: Not on file    Past Surgical History:  Procedure Laterality Date   BACK SURGERY  1984   BREAST CYST EXCISION     COLOSTOMY     COLOSTOMY REVERSAL     EXTRACORPOREAL SHOCK WAVE LITHOTRIPSY Left 02/09/2017   Procedure:  LEFT EXTRACORPOREAL SHOCK WAVE LITHOTRIPSY (ESWL);  Surgeon: Bjorn Pippin, MD;  Location: [facility];  Service: Urology;  Laterality: Left;   herniated disc     MENISCUS REPAIR     left knee torn   PARATHYROIDECTOMY     TONSILLECTOMY        Current Outpatient Medications:    acetaminophen (TYLENOL) 650 MG CR tablet, Take 650 mg by mouth every 8 (eight) hours., Disp: , Rfl:    Cholecalciferol 125 MCG (5000 UT) TABS, Take 5,000 Units by mouth every other day., Disp: , Rfl:    omega-3 acid ethyl esters (LOVAZA) 1 g capsule, Take 1 g by mouth daily. , Disp: , Rfl:    rosuvastatin (CRESTOR) 20 MG tablet, , Disp: , Rfl:    sodium chloride (OCEAN) 0.65 % SOLN nasal spray, Place 1 spray into both nostrils as needed for congestion., Disp: , Rfl:    thyroid (ARMOUR) 15 MG tablet, Take 15 mg by mouth daily., Disp: , Rfl:    ferrous sulfate 325 (65 FE) MG tablet, Take 1 tablet (325 mg total) by mouth daily with breakfast., Disp: 30 tablet, Rfl: 0   fluticasone (FLONASE) 50 MCG/ACT nasal spray,  Place 1 spray into both nostrils 2 (two) times daily for 14 days., Disp: 1 g, Rfl: 0    Physical Exam: Blood pressure 120/86, pulse 70, height 5\' 7"  (1.702 m), weight 133 lb (60.3 kg), SpO2 95%.    Affect appropriate Elderly female  HEENT: normal Neck supple with no adenopathy JVP normal no bruits no thyromegaly Lungs clear with no wheezing and good diaphragmatic motion Heart:  S1/S2 no murmur, no rub, gallop or click PMI normal Abdomen: benighn, BS positve, no tenderness, no AAA no bruit.  No HSM or HJR Distal pulses intact with no bruits No edema Neuro non-focal Skin warm and dry No muscular weakness  Labs:   Lab Results  Component Value Date   WBC 5.8 09/14/2020   HGB 10.0 (L) 09/14/2020   HCT 31.6 (L) 09/14/2020   MCV 90.5 09/14/2020   PLT 370 09/14/2020      Radiology: No results found.  EKG: SR rate 70 normal    ASSESSMENT AND PLAN:   Chronic diastolic CHF:  diagnosis not clear at all.   will order TTE to assess EF , diastolic parameters RV function and valves Hypothyroid:  TSH normal continue replacement HLD:  continue crestor Anemia:  stable iron supplements  RA:  ? On Plaquenil in past per primary  TTE BMET/BNP  Patient is DNR , elderly Big cardiac w/u not indicated  F/U PRN pending echo results   Signed: Charlton Haws 09/21/2023, 9:09 AM

## 2023-09-21 ENCOUNTER — Encounter: Payer: Self-pay | Admitting: Cardiovascular Disease

## 2023-09-21 ENCOUNTER — Ambulatory Visit: Payer: Medicare Other | Attending: Cardiovascular Disease | Admitting: Cardiovascular Disease

## 2023-09-21 VITALS — BP 120/86 | HR 70 | Ht 67.0 in | Wt 133.0 lb

## 2023-09-21 DIAGNOSIS — I5032 Chronic diastolic (congestive) heart failure: Secondary | ICD-10-CM

## 2023-09-21 NOTE — Patient Instructions (Signed)
Medication Instructions:  Your physician recommends that you continue on your current medications as directed. Please refer to the Current Medication list given to you today.  *If you need a refill on your cardiac medications before your next appointment, please call your pharmacy*  Lab Work: If you have labs (blood work) drawn today and your tests are completely normal, you will receive your results only by: MyChart Message (if you have MyChart) OR A paper copy in the mail If you have any lab test that is abnormal or we need to change your treatment, we will call you to review the results.  Testing/Procedures: Your physician has requested that you have an echocardiogram. Echocardiography is a painless test that uses sound waves to create images of your heart. It provides your doctor with information about the size and shape of your heart and how well your heart's chambers and valves are working. This procedure takes approximately one hour. There are no restrictions for this procedure. Please do NOT wear cologne, perfume, aftershave, or lotions (deodorant is allowed). Please arrive 15 minutes prior to your appointment time.  Please note: We ask at that you not bring children with you during ultrasound (echo/ vascular) testing. Due to room size and safety concerns, children are not allowed in the ultrasound rooms during exams. Our front office staff cannot provide observation of children in our lobby area while testing is being conducted. An adult accompanying a patient to their appointment will only be allowed in the ultrasound room at the discretion of the ultrasound technician under special circumstances. We apologize for any inconvenience.  Follow-Up: At [facility], you and your health needs are our priority.  As part of our continuing mission to provide you with exceptional heart care, we have created designated Provider Care Teams.  These Care Teams include your primary  Cardiologist (physician) and Advanced Practice Providers (APPs -  Physician Assistants and Nurse Practitioners) who all work together to provide you with the care you need, when you need it.  We recommend signing up for the patient portal called "MyChart".  Sign up information is provided on this After Visit Summary.  MyChart is used to connect with patients for Virtual Visits (Telemedicine).  Patients are able to view lab/test results, encounter notes, upcoming appointments, etc.  Non-urgent messages can be sent to your provider as well.   To learn more about what you can do with MyChart, go to [email or web address].    Your next appointment:   As needed  Provider:   Dr. Eden Emms

## 2023-09-22 LAB — BASIC METABOLIC PANEL
BUN/Creatinine Ratio: 26 (ref 12–28)
BUN: 14 mg/dL (ref 8–27)
CO2: 20 mmol/L (ref 20–29)
Calcium: 9.2 mg/dL (ref 8.7–10.3)
Chloride: 104 mmol/L (ref 96–106)
Creatinine, Ser: 0.54 mg/dL — ABNORMAL LOW (ref 0.57–1.00)
Glucose: 89 mg/dL (ref 70–99)
Potassium: 4.7 mmol/L (ref 3.5–5.2)
Sodium: 138 mmol/L (ref 134–144)
eGFR: 91 mL/min/{1.73_m2} (ref >59)

## 2023-09-22 LAB — PRO B NATRIURETIC PEPTIDE: NT-Pro BNP: 158 pg/mL (ref 0–738)

## 2023-09-27 ENCOUNTER — Telehealth: Payer: Self-pay | Admitting: Cardiovascular Disease

## 2023-09-27 NOTE — Telephone Encounter (Signed)
Called patient's husband (DPR) back about message. Encouraged them to keep echo appt to check the structural part of the heart. They agreed to do the echo.

## 2023-09-27 NOTE — Telephone Encounter (Signed)
Pt received message about lab results. Husband wants to know if she sill needs to have Echo done. Please advise

## 2023-10-23 ENCOUNTER — Ambulatory Visit (HOSPITAL_COMMUNITY): Payer: Medicare Other | Attending: Cardiovascular Disease

## 2023-10-23 DIAGNOSIS — I5032 Chronic diastolic (congestive) heart failure: Secondary | ICD-10-CM | POA: Diagnosis present

## 2023-10-23 LAB — ECHOCARDIOGRAM COMPLETE: S' Lateral: 2.52 cm

## 2023-10-30 ENCOUNTER — Telehealth: Payer: Self-pay | Admitting: Cardiovascular Disease

## 2023-10-30 NOTE — Telephone Encounter (Signed)
The patient's husband has been notified of the result and verbalized understanding.  All questions (if any) were answered. Ethelda Chick, RN 10/30/2023 2:13 PM

## 2023-10-30 NOTE — Telephone Encounter (Signed)
Pt calling to find out results on their ECHO. Please advise

## 2023-12-13 ENCOUNTER — Emergency Department (HOSPITAL_COMMUNITY)
Admission: EM | Admit: 2023-12-13 | Discharge: 2023-12-13 | Disposition: A | Discharge status: Home / Self Care | Payer: Medicare Other | Attending: Emergency Medicine | Admitting: Emergency Medicine

## 2023-12-13 ENCOUNTER — Encounter (HOSPITAL_COMMUNITY): Payer: Self-pay

## 2023-12-13 ENCOUNTER — Emergency Department (HOSPITAL_COMMUNITY): Payer: Medicare Other

## 2023-12-13 ENCOUNTER — Other Ambulatory Visit: Payer: Self-pay

## 2023-12-13 DIAGNOSIS — N189 Chronic kidney disease, unspecified: Secondary | ICD-10-CM | POA: Diagnosis not present

## 2023-12-13 DIAGNOSIS — R1030 Lower abdominal pain, unspecified: Secondary | ICD-10-CM | POA: Diagnosis present

## 2023-12-13 DIAGNOSIS — I509 Heart failure, unspecified: Secondary | ICD-10-CM | POA: Diagnosis not present

## 2023-12-13 DIAGNOSIS — E039 Hypothyroidism, unspecified: Secondary | ICD-10-CM | POA: Insufficient documentation

## 2023-12-13 DIAGNOSIS — F0390 Unspecified dementia without behavioral disturbance: Secondary | ICD-10-CM | POA: Diagnosis not present

## 2023-12-13 DIAGNOSIS — R1084 Generalized abdominal pain: Secondary | ICD-10-CM

## 2023-12-13 DIAGNOSIS — N200 Calculus of kidney: Secondary | ICD-10-CM

## 2023-12-13 LAB — COMPREHENSIVE METABOLIC PANEL
ALT: 10 U/L (ref 0–44)
AST: 20 U/L (ref 15–41)
Albumin: 3.5 g/dL (ref 3.5–5.0)
Alkaline Phosphatase: 69 U/L (ref 38–126)
Anion gap: 11 (ref 5–15)
BUN: 25 mg/dL — ABNORMAL HIGH (ref 8–23)
CO2: 21 mmol/L — ABNORMAL LOW (ref 22–32)
Calcium: 8.9 mg/dL (ref 8.9–10.3)
Chloride: 103 mmol/L (ref 98–111)
Creatinine, Ser: 0.72 mg/dL (ref 0.44–1.00)
GFR, Estimated: >60 mL/min (ref >60)
Glucose, Bld: 131 mg/dL — ABNORMAL HIGH (ref 70–99)
Potassium: 3.7 mmol/L (ref 3.5–5.1)
Sodium: 135 mmol/L (ref 135–145)
Total Bilirubin: 0.9 mg/dL (ref 0.0–1.2)
Total Protein: 8 g/dL (ref 6.5–8.1)

## 2023-12-13 LAB — CBC WITH DIFFERENTIAL/PLATELET
Abs Immature Granulocytes: 0.6 10*3/uL — ABNORMAL HIGH (ref 0.00–0.07)
Basophils Absolute: 0 10*3/uL (ref 0.0–0.1)
Basophils Relative: 0 %
Eosinophils Absolute: 0 10*3/uL (ref 0.0–0.5)
Eosinophils Relative: 0 %
HCT: 33.9 % — ABNORMAL LOW (ref 36.0–46.0)
Hemoglobin: 11.2 g/dL — ABNORMAL LOW (ref 12.0–15.0)
Immature Granulocytes: 3 %
Lymphocytes Relative: 4 %
Lymphs Abs: 0.9 10*3/uL (ref 0.7–4.0)
MCH: 30.8 pg (ref 26.0–34.0)
MCHC: 33 g/dL (ref 30.0–36.0)
MCV: 93.1 fL (ref 80.0–100.0)
Monocytes Absolute: 1.1 10*3/uL — ABNORMAL HIGH (ref 0.1–1.0)
Monocytes Relative: 5 %
Neutro Abs: 19.7 10*3/uL — ABNORMAL HIGH (ref 1.7–7.7)
Neutrophils Relative %: 88 %
Platelets: 277 10*3/uL (ref 150–400)
RBC: 3.64 MIL/uL — ABNORMAL LOW (ref 3.87–5.11)
RDW: 13.7 % (ref 11.5–15.5)
WBC: 22.3 10*3/uL — ABNORMAL HIGH (ref 4.0–10.5)
nRBC: 0 % (ref 0.0–0.2)

## 2023-12-13 LAB — WET PREP, GENITAL
Clue Cells Wet Prep HPF POC: NONE SEEN
Sperm: NONE SEEN
Trich, Wet Prep: NONE SEEN
WBC, Wet Prep HPF POC: <10 (ref <10)
Yeast Wet Prep HPF POC: NONE SEEN

## 2023-12-13 LAB — URINALYSIS, ROUTINE W REFLEX MICROSCOPIC
Bilirubin Urine: NEGATIVE
Glucose, UA: NEGATIVE mg/dL
Ketones, ur: 5 mg/dL — AB
Nitrite: NEGATIVE
Protein, ur: 100 mg/dL — AB
RBC / HPF: >50 RBC/hpf (ref 0–5)
Specific Gravity, Urine: >1.046 — ABNORMAL HIGH (ref 1.005–1.030)
WBC, UA: >50 WBC/hpf (ref 0–5)
pH: 5 (ref 5.0–8.0)

## 2023-12-13 LAB — TSH: TSH: 0.373 u[IU]/mL (ref 0.350–4.500)

## 2023-12-13 LAB — LIPASE, BLOOD: Lipase: 22 U/L (ref 11–51)

## 2023-12-13 MED ORDER — IOHEXOL 300 MG/ML  SOLN
100.0000 mL | Freq: Once | INTRAMUSCULAR | Status: AC | PRN
  Administered 2023-12-13: 100 mL via INTRAVENOUS

## 2023-12-13 MED ORDER — SODIUM CHLORIDE 0.9 % IV SOLN: INTRAVENOUS | Status: DC

## 2023-12-13 MED ORDER — TAMSULOSIN HCL 0.4 MG PO CAPS: 0.4000 mg | ORAL_CAPSULE | Freq: Every day | ORAL | 0 refills | Status: AC

## 2023-12-13 MED ORDER — DEXAMETHASONE SODIUM PHOSPHATE 4 MG/ML IJ SOLN
4.0000 mg | Freq: Once | INTRAMUSCULAR | Status: AC
  Administered 2023-12-13: 4 mg via INTRAVENOUS
  Filled 2023-12-13: qty 1

## 2023-12-13 MED ORDER — TAMSULOSIN HCL 0.4 MG PO CAPS
0.4000 mg | ORAL_CAPSULE | Freq: Once | ORAL | Status: AC
  Administered 2023-12-13: 0.4 mg via ORAL
  Filled 2023-12-13: qty 1

## 2023-12-13 MED ORDER — CEPHALEXIN 500 MG PO CAPS
500.0000 mg | ORAL_CAPSULE | Freq: Once | ORAL | Status: AC
  Administered 2023-12-13: 500 mg via ORAL
  Filled 2023-12-13: qty 1

## 2023-12-13 MED ORDER — CEPHALEXIN 500 MG PO CAPS: 500.0000 mg | ORAL_CAPSULE | Freq: Two times a day (BID) | ORAL | 0 refills | Status: AC

## 2023-12-13 MED ORDER — SODIUM CHLORIDE 0.9 % IV BOLUS: 500.0000 mL | Freq: Once | INTRAVENOUS | Status: DC

## 2023-12-13 NOTE — ED Provider Notes (Addendum)
 [place] EMERGENCY DEPARTMENT AT [facility] Provider Note   CSN: [id number] Arrival date & time: 12/13/23  1016     History  Chief Complaint  Patient presents with   Abdominal Pain    Stephanie Ray is a 83 y.o. female.  Patient with a complaint of abdominal pain for 3 days.  No vomiting.  Abdominal pains been getting worse.  Husband has to wipe her because she has significant arthritic changes to her hands and also has dementia.  He has noticed a brown vaginal discharge for the past 3 days.  Temp here 98.2 pulse 84 respiration 16 blood pressure 111/100.  Oxygen saturation 94%.  Past medical history significant for the dementia hyperlipidemia rupture of transverse colon congestive heart failure rheumatoid arthritis hypothyroidism chronic kidney disease.  Past surgical history significant for colostomy reversal.  No fevers.  No diarrhea.       Home Medications Prior to Admission medications   Medication Sig Start Date End Date Taking? Authorizing Provider  acetaminophen  (TYLENOL ) 650 MG CR tablet Take 650 mg by mouth every 8 (eight) hours.    [provider]  Cholecalciferol 125 MCG (5000 UT) TABS Take 5,000 Units by mouth every other day.    [provider]  ferrous sulfate  325 (65 FE) MG tablet Take 1 tablet (325 mg total) by mouth daily with breakfast. 09/15/20 10/15/20  Arrien, Elidia Sieving, MD  fluticasone  (FLONASE ) 50 MCG/ACT nasal spray Place 1 spray into both nostrils 2 (two) times daily for 14 days. 09/14/20 09/28/20  Arrien, Mauricio Daniel, MD  omega-3 acid ethyl esters (LOVAZA ) 1 g capsule Take 1 g by mouth daily.     [provider]  rosuvastatin (CRESTOR) 20 MG tablet  08/02/23   [provider]  sodium chloride  (OCEAN) 0.65 % SOLN nasal spray Place 1 spray into both nostrils as needed for congestion.    [provider]  thyroid  (ARMOUR) 15 MG tablet Take 15 mg by mouth daily.    [provider]       Allergies    Codeine, Erythromycin, Ketorolac, Morphine, Naproxen sodium, Penicillins, and Sulfonamide derivatives    Review of Systems   Review of Systems  Constitutional:  Negative for chills and fever.  HENT:  Negative for ear pain and sore throat.   Eyes:  Negative for pain and visual disturbance.  Respiratory:  Negative for cough and shortness of breath.   Cardiovascular:  Negative for chest pain and palpitations.  Gastrointestinal:  Positive for abdominal pain. Negative for vomiting.  Genitourinary:  Positive for vaginal discharge. Negative for dysuria and hematuria.  Musculoskeletal:  Negative for arthralgias and back pain.  Skin:  Negative for color change and rash.  Neurological:  Negative for seizures and syncope.  Psychiatric/Behavioral:  Positive for confusion.   All other systems reviewed and are negative.   Physical Exam Updated Vital Signs BP (!) 111/100 (BP Location: Right Arm)   Pulse 84   Temp 98.2 F (36.8 C) (Oral)   Resp 16   Ht 1.702 m (5' 7)   Wt 60.3 kg   SpO2 94%   BMI 20.82 kg/m  Physical Exam Vitals and nursing note reviewed.  Constitutional:      General: She is not in acute distress.    Appearance: Normal appearance. She is well-developed. She is not ill-appearing.  HENT:     Head: Normocephalic and atraumatic.  Eyes:     Extraocular Movements: Extraocular movements intact.  Conjunctiva/sclera: Conjunctivae normal.     Pupils: Pupils are equal, round, and reactive to light.  Cardiovascular:     Rate and Rhythm: Normal rate and regular rhythm.     Heart sounds: No murmur heard. Pulmonary:     Effort: Pulmonary effort is normal. No respiratory distress.     Breath sounds: Normal breath sounds.  Abdominal:     General: There is no distension.     Palpations: Abdomen is soft.     Tenderness: There is abdominal tenderness. There is guarding.     Comments: Diffuse tenderness to the abdomen seems to be some lower quadrant guarding.   Genitourinary:    Comments: No evidence of any blood in the rectum.  No evidence of any blood in the vaginal area.  Wet prep and chlamydia GC culture taken.  There was some what appeared to be dark dried blood on the right inner thigh area.  Possible it could be coming from the bladder. Musculoskeletal:        General: No swelling.     Cervical back: Normal range of motion and neck supple.  Skin:    General: Skin is warm and dry.     Capillary Refill: Capillary refill takes less than 2 seconds.  Neurological:     General: No focal deficit present.     Mental Status: She is alert and oriented to person, place, and time.  Psychiatric:        Mood and Affect: Mood normal.     ED Results / Procedures / Treatments   Labs (all labs ordered are listed, but only abnormal results are displayed) Labs Reviewed  LIPASE, BLOOD  COMPREHENSIVE METABOLIC PANEL  URINALYSIS, ROUTINE W REFLEX MICROSCOPIC  CBC WITH DIFFERENTIAL/PLATELET  TSH    EKG None  Radiology No results found.  Procedures Procedures    Medications Ordered in ED Medications - No data to display  ED Course/ Medical Decision Making/ A&P                                 Medical Decision Making Amount and/or Complexity of Data Reviewed Labs: ordered.   Patient with some diffuse tenderness throughout the abdomen.  Seen to be greater in the lower quadrants.  Vital signs reassuring.  Patient will be checked by get a tech to see with the vaginal discharge seems to be like.  But with the abdominal tenderness certainly needs CT abdomen and pelvis.  Patient's labs are still pending.  No evidence of any blood vaginally or rectally there was some dried dark what appeared to be blood on the right inner thigh area.  Possibly could be coming from the bladder.  Will have him do an In-N-Out cath.  Will proceed with the CT scans.   Final Clinical Impression(s) / ED Diagnoses Final diagnoses:  Generalized abdominal pain     Rx / DC Orders ED Discharge Orders     None         Geraldene Hamilton, MD 12/13/23 1548    Geraldene Hamilton, MD 12/13/23 1549    Geraldene Hamilton, MD 12/13/23 [id number]

## 2023-12-13 NOTE — Discharge Instructions (Signed)
 You have been diagnosed with a kidney stone.  Typically these pass over a few days, though they are typically associated with pain and some nausea.  Take all medication as directed.  Return here for any concerning changes in your condition.

## 2023-12-13 NOTE — ED Provider Triage Note (Signed)
 Emergency Medicine Provider Triage Evaluation Note  CHELCY BOLDA , a 83 y.o. female  was evaluated in triage.  Pt complains of abd pain. Recently diagnosed with UTI, on abx.  Having worsening abd pain, urinary discomfort and now husband noticed brown vaginal discharge x 3 days.   Review of Systems  Positive: As above Negative: As above  Physical Exam  BP 125/69 (BP Location: Left Arm)   Pulse 88   Temp 97.7 F (36.5 C) (Oral)   Resp 16   Ht 5' 7 (1.702 m)   Wt 60.3 kg   SpO2 98%   BMI 20.82 kg/m  Gen:   Awake, no distress   Resp:  Normal effort  MSK:   Moves extremities without difficulty  Other:    Medical Decision Making  Medically screening exam initiated at 10:38 AM.  Appropriate orders placed.  Cathlean LITTIE Ghent was informed that the remainder of the evaluation will be completed by another provider, this initial triage assessment does not replace that evaluation, and the importance of remaining in the ED until their evaluation is complete.     Nivia Colon, PA-C 12/13/23 1038

## 2023-12-13 NOTE — ED Provider Notes (Signed)
 Care of the patient assumed at signout from Dr. Zackowski.  On signout patient awaiting CT, labs, analysis.  10:34 PM Urinalysis now available.  I have reviewed the patient CT scan, urinalysis, labs.  Patient is afebrile, hemodynamically unremarkable, sleeping, awakens easily.  I have reviewed all results with the patient's husband.  Patient has dementia.  We discussed kidney stones with minimal evidence for infection.  She does have leukocytosis, but has no fever, no hypotension, no evidence for sepsis bacteremia.  We discussed admission versus initiation of meds, close outpatient follow-up.  Given these findings, patient will start antibiotics, Flomax , Toradol, will follow-up with primary care or urology.   Garrick Charleston, MD 12/13/23 2236

## 2023-12-13 NOTE — ED Triage Notes (Signed)
 Patient is here for evaluation of abdominal pain. Reports "brown discharge out of her vagina" going on for the past 3 days. Husband states when he wipes her he sees the brown discharge. Denies any diarrhea or vomiting.

## 2023-12-14 LAB — GC/CHLAMYDIA PROBE AMP (~~LOC~~) NOT AT ARMC
Chlamydia: NEGATIVE
Comment: NEGATIVE
Comment: NORMAL
Neisseria Gonorrhea: NEGATIVE
